# Patient Record
Sex: Male | Born: 1965 | Race: Black or African American | Hispanic: No | Marital: Single | Smoking: Former smoker
Health system: Southern US, Community
[De-identification: ages and names within clinical notes are randomized; demographics above are authoritative.]

## PROBLEM LIST (undated history)

## (undated) DIAGNOSIS — F909 Attention-deficit hyperactivity disorder, unspecified type: Secondary | ICD-10-CM

## (undated) HISTORY — DX: Attention-deficit hyperactivity disorder, unspecified type: F90.9

## (undated) HISTORY — PX: NO PAST SURGERIES: SHX2092

---

## 2015-05-09 ENCOUNTER — Ambulatory Visit (INDEPENDENT_AMBULATORY_CARE_PROVIDER_SITE_OTHER): Payer: No Typology Code available for payment source | Admitting: Allergy and Immunology

## 2015-05-09 ENCOUNTER — Encounter: Payer: Self-pay | Admitting: Allergy and Immunology

## 2015-05-09 VITALS — BP 102/68 | HR 76 | Temp 98.2°F | Resp 16 | Ht 75.0 in | Wt 173.3 lb

## 2015-05-09 DIAGNOSIS — J3089 Other allergic rhinitis: Secondary | ICD-10-CM

## 2015-05-09 MED ORDER — LEVOCETIRIZINE DIHYDROCHLORIDE 5 MG PO TABS: 5.0000 mg | ORAL_TABLET | Freq: Every evening | ORAL | Status: DC

## 2015-05-09 MED ORDER — AZELASTINE-FLUTICASONE 137-50 MCG/ACT NA SUSP: 1.0000 | Freq: Two times a day (BID) | NASAL | Status: DC

## 2015-05-09 NOTE — Patient Instructions (Addendum)
Other allergic rhinitis  Aeroallergen avoidance measures have been discussed and provided in written form.  A prescription has been provided for levocetirizine, 5 mg daily as needed.  A prescription has been provided for Dymista (azelastine/fluticasone) nasal spray, 1 spray per nostril twice daily as needed. Proper nasal spray technique has been discussed and demonstrated.  Nasal saline lavage as needed has been recommended along with instructions for proper administration.  The risks and benefits of aeroallergen immunotherapy have been discussed. The patient is motivated to initiate immunotherapy to reduce symptoms and decrease medication requirement. Informed consent has been signed and allergen vaccine orders have been submitted. Medications will be decreased or discontinued as symptom relief from immunotherapy becomes evident.     Return in about 6 weeks (around 06/20/2015), or if symptoms worsen or fail to improve.  Reducing Pollen Exposure  The American Academy of Allergy, Asthma and Immunology suggests the following steps to reduce your exposure to pollen during allergy seasons.    1. Do not hang sheets or clothing out to dry; pollen may collect on these items. 2. Do not mow lawns or spend time around freshly cut grass; mowing stirs up pollen. 3. Keep windows closed at night.  Keep car windows closed while driving. 4. Minimize morning activities outdoors, a time when pollen counts are usually at their highest. 5. Stay indoors as much as possible when pollen counts or humidity is high and on windy days when pollen tends to remain in the air longer. 6. Use air conditioning when possible.  Many air conditioners have filters that trap the pollen spores. 7. Use a HEPA room air filter to remove pollen form the indoor air you breathe.   Control of House Dust Mite Allergen  House dust mites play a major role in allergic asthma and rhinitis.  They occur in environments with high  humidity wherever human skin, the food for dust mites is found. High levels have been detected in dust obtained from mattresses, pillows, carpets, upholstered furniture, bed covers, clothes and soft toys.  The principal allergen of the house dust mite is found in its feces.  A gram of dust may contain 1,000 mites and 250,000 fecal particles.  Mite antigen is easily measured in the air during house cleaning activities.    1. Encase mattresses, including the box spring, and pillow, in an air tight cover.  Seal the zipper end of the encased mattresses with wide adhesive tape. 2. Wash the bedding in water of 130 degrees Farenheit weekly.  Avoid cotton comforters/quilts and flannel bedding: the most ideal bed covering is the dacron comforter. 3. Remove all upholstered furniture from the bedroom. 4. Remove carpets, carpet padding, rugs, and non-washable window drapes from the bedroom.  Wash drapes weekly or use plastic window coverings. 5. Remove all non-washable stuffed toys from the bedroom.  Wash stuffed toys weekly. 6. Have the room cleaned frequently with a vacuum cleaner and a damp dust-mop.  The patient should not be in a room which is being cleaned and should wait 1 hour after cleaning before going into the room. 7. Close and seal all heating outlets in the bedroom.  Otherwise, the room will become filled with dust-laden air.  An electric heater can be used to heat the room. 8. Reduce indoor humidity to less than 50%.  Do not use a humidifier.  Control of Cockroach Allergen  Cockroach allergen has been identified as an important cause of acute attacks of asthma, especially in urban settings.  There  are fifty-five species of cockroach that exist in the [place], however only three, the [place], [place] species produce allergen that can affect patients with Asthma.  Allergens can be obtained from fecal particles, egg casings and secretions from cockroaches.    1. Remove food  sources. 2. Reduce access to water. 3. Seal access and entry points. 4. Spray runways with 0.5-1% Diazinon or Chlorpyrifos 5. Blow boric acid power under stoves and refrigerator. 6. Place bait stations (hydramethylnon) at feeding sites.

## 2015-05-09 NOTE — Progress Notes (Signed)
History of present illness: HPI Comments: Mark [name] is a 49 y.o. male who presents today for his initial consultation for allergies.  He experiences frequent nasal congestion, rhinorrhea, sneezing, postnasal drainage, and itchy/watery eyes.  These symptoms occur year around but seem to be most severe when pollen counts are high. Cetirizine, fexofenadine, loratadine, and fluticasone nasal spray have failed to provide adequate symptom relief.  He denies a history of asthma or significant lower respiratory symptoms.   Assessment and plan: Other allergic rhinitis  Aeroallergen avoidance measures have been discussed and provided in written form.  A prescription has been provided for levocetirizine, 5 mg daily as needed.  A prescription has been provided for Dymista (azelastine/fluticasone) nasal spray, 1 spray per nostril twice daily as needed. Proper nasal spray technique has been discussed and demonstrated.  Nasal saline lavage as needed has been recommended along with instructions for proper administration.  The risks and benefits of aeroallergen immunotherapy have been discussed. The patient is motivated to initiate immunotherapy to reduce symptoms and decrease medication requirement. Informed consent has been signed and allergen vaccine orders have been submitted. Medications will be decreased or discontinued as symptom relief from immunotherapy becomes evident.     Medications ordered this encounter: Meds ordered this encounter  Medications  . Azelastine-Fluticasone 137-50 MCG/ACT SUSP    Sig: Place 1 spray into the nose 2 (two) times daily.    Dispense:  1 Bottle    Refill:  5  . levocetirizine (XYZAL) 5 MG tablet    Sig: Take 1 tablet (5 mg total) by mouth every evening.    Dispense:  30 tablet    Refill:  5    Aeroallergen immunotherapy   Diagnositics: Allergy skin testing: Positive to grass pollen, weed pollen, ragweed pollen, tree pollen, dust mite, and cockroach  antigen.     Physical examination: Blood pressure 102/68, pulse 76, temperature 98.2 F (36.8 C), temperature source Oral, resp. rate 16, height  (1.905 m), weight 173 lb 4.5 oz (78.6 kg).  General: Alert, interactive, in no acute distress. HEENT: TMs pearly gray, turbinates moderately edematous with clear discharge, post-pharynx moderately erythematous. Neck: Supple without lymphadenopathy. Lungs: Clear to auscultation without wheezing, rhonchi or rales. CV: Normal S1, S2 without murmurs. Abdomen: Nondistended, nontender. Skin: Warm and dry, without lesions or rashes. Extremities:  No clubbing, cyanosis or edema. Neuro:   Grossly intact.  Review of systems: Review of Systems  Constitutional: Negative for fever, chills and weight loss.  HENT: Negative for nosebleeds.   Eyes: Negative for blurred vision.  Respiratory: Negative for hemoptysis.   Cardiovascular: Negative for chest pain.  Gastrointestinal: Negative for diarrhea and constipation.  Genitourinary: Negative for dysuria.  Musculoskeletal: Negative for myalgias and joint pain.  Neurological: Negative for dizziness.  Endo/Heme/Allergies: Does not bruise/bleed easily.    Past medical history: Past Medical History  Diagnosis Date  . ADHD (attention deficit hyperactivity disorder)     Past surgical history: Past Surgical History  Procedure Laterality Date  . No past surgeries      Family history: Family History  Problem Relation Age of Onset  . Allergic rhinitis Mother   . Allergic rhinitis Sister     Social history: Social History   Social History  . Marital Status: Single    Spouse Name: N/A  . Number of Children: N/A  . Years of Education: N/A   Occupational History  . Not on file.   Social History Main Topics  . Smoking status: Former Smoker  Quit date: 05/07/2014  . Smokeless tobacco: Not on file  . Alcohol Use: Not on file  . Drug Use: Not on file  . Sexual Activity: Not on file    Other Topics Concern  . Not on file   Social History Narrative  . No narrative on file   Environmental History:  Mark [name] lives in a 50 year old condominium with hardwood floors throughout and central air/heat.  He is a former smoker without pets.  Known medication allergies: No Known Allergies  Outpatient medications:   Medication List       This list is accurate as of: 05/09/15  1:11 PM.  Always use your most recent med list.               ADDERALL 30 MG tablet  Generic drug:  amphetamine-dextroamphetamine  Take 30 mg by mouth daily.     Azelastine-Fluticasone 137-50 MCG/ACT Susp  Place 1 spray into the nose 2 (two) times daily.     levocetirizine 5 MG tablet  Commonly known as:  XYZAL  Take 1 tablet (5 mg total) by mouth every evening.        I appreciate the opportunity to take part in this Leighton's care. Please do not hesitate to contact me with questions.  Sincerely,   R. Jorene Guestarter Lonni Dirden, MD

## 2015-05-09 NOTE — Assessment & Plan Note (Addendum)
   Aeroallergen avoidance measures have been discussed and provided in written form.  A prescription has been provided for levocetirizine, 5 mg daily as needed.  A prescription has been provided for Dymista (azelastine/fluticasone) nasal spray, 1 spray per nostril twice daily as needed. Proper nasal spray technique has been discussed and demonstrated.  Nasal saline lavage as needed has been recommended along with instructions for proper administration.  The risks and benefits of aeroallergen immunotherapy have been discussed. The patient is motivated to initiate immunotherapy to reduce symptoms and decrease medication requirement. Informed consent has been signed and allergen vaccine orders have been submitted. Medications will be decreased or discontinued as symptom relief from immunotherapy becomes evident.

## 2015-05-10 DIAGNOSIS — J301 Allergic rhinitis due to pollen: Secondary | ICD-10-CM | POA: Diagnosis not present

## 2015-05-11 DIAGNOSIS — J3089 Other allergic rhinitis: Secondary | ICD-10-CM | POA: Diagnosis not present

## 2015-06-20 ENCOUNTER — Ambulatory Visit: Payer: Non-veteran care | Admitting: Allergy and Immunology

## 2015-07-10 ENCOUNTER — Ambulatory Visit: Payer: Non-veteran care | Admitting: Allergy and Immunology

## 2016-02-26 ENCOUNTER — Emergency Department (HOSPITAL_COMMUNITY): Payer: Non-veteran care

## 2016-02-26 ENCOUNTER — Emergency Department (HOSPITAL_COMMUNITY)
Admission: EM | Admit: 2016-02-26 | Discharge: 2016-02-27 | Disposition: A | Discharge status: Home / Self Care | Payer: Non-veteran care | Attending: Emergency Medicine | Admitting: Emergency Medicine

## 2016-02-26 ENCOUNTER — Encounter (HOSPITAL_COMMUNITY): Payer: Self-pay | Admitting: Emergency Medicine

## 2016-02-26 DIAGNOSIS — Z87891 Personal history of nicotine dependence: Secondary | ICD-10-CM | POA: Diagnosis not present

## 2016-02-26 DIAGNOSIS — R0789 Other chest pain: Secondary | ICD-10-CM | POA: Insufficient documentation

## 2016-02-26 DIAGNOSIS — F909 Attention-deficit hyperactivity disorder, unspecified type: Secondary | ICD-10-CM | POA: Diagnosis not present

## 2016-02-26 DIAGNOSIS — R079 Chest pain, unspecified: Secondary | ICD-10-CM

## 2016-02-26 LAB — I-STAT TROPONIN, ED: Troponin i, poc: 0 ng/mL (ref 0.00–0.08)

## 2016-02-26 LAB — CBC
HEMATOCRIT: 39.8 % (ref 39.0–52.0)
Hemoglobin: 13.8 g/dL (ref 13.0–17.0)
MCH: 33.7 pg (ref 26.0–34.0)
MCHC: 34.7 g/dL (ref 30.0–36.0)
MCV: 97.3 fL (ref 78.0–100.0)
PLATELETS: 307 10*3/uL (ref 150–400)
RBC: 4.09 MIL/uL — AB (ref 4.22–5.81)
RDW: 12.6 % (ref 11.5–15.5)
WBC: 6.7 10*3/uL (ref 4.0–10.5)

## 2016-02-26 LAB — BASIC METABOLIC PANEL
Anion gap: 10 (ref 5–15)
CHLORIDE: 93 mmol/L — AB (ref 101–111)
CO2: 24 mmol/L (ref 22–32)
Calcium: 9.4 mg/dL (ref 8.9–10.3)
Creatinine, Ser: 0.75 mg/dL (ref 0.61–1.24)
Glucose, Bld: 181 mg/dL — ABNORMAL HIGH (ref 65–99)
POTASSIUM: 3.8 mmol/L (ref 3.5–5.1)
SODIUM: 127 mmol/L — AB (ref 135–145)

## 2016-02-26 NOTE — ED Triage Notes (Signed)
Pt sts left sided CP a couple of hours ago that is intermittent in nature; pt denies SOB

## 2016-02-26 NOTE — ED Provider Notes (Signed)
TIME SEEN: 11:21 PM  CHIEF COMPLAINT: Chest Pain  HPI: Mark [name] is a 50 y.o. male with h/o ADD who presents to the Emergency Department complaining of sharp, intermittent, left sided CP onset 3 PM today. Pt denies ever having CP episodes in the past. Pt is unsure if his current symptoms are due to his taking adderall for his ADD. Pt reports that when he has the CP episodes, it will last for 5-10 seconds. Pt states that his CP is alleviated with position change. He states that he is having associated symptoms of clammy hands, mild SOB, and dizziness. He states that he has not tried any medications for the relief for his symptoms. He denies nausea, vomiting, leg swelling, and any other symptoms. Denies high cholesterol, DM, and HTN. Pt is an occasional cigarette smoker. Denies family hx of cardiac issues. Denies PMHx of DVT, PE, lower extremity swelling or pain, fractures, recent hospitalization, long travel or other immobilization. Asymptomatic currently.    ROS: See HPI Constitutional: no fever  Eyes: no drainage  ENT: no runny nose   Cardiovascular:  +chest pain  Resp: +SOB  GI: no vomiting GU: no dysuria Integumentary: no rash  Allergy: no hives  Musculoskeletal: no leg swelling  Neurological: no slurred speech ROS otherwise negative  PAST MEDICAL HISTORY/PAST SURGICAL HISTORY:  Past Medical History:  Diagnosis Date  . ADHD (attention deficit hyperactivity disorder)     MEDICATIONS:  Prior to Admission medications   Medication Sig Start Date End Date Taking? Authorizing Provider  amphetamine-dextroamphetamine (ADDERALL) 30 MG tablet Take 30 mg by mouth daily.    Historical Provider, MD  Azelastine-Fluticasone 137-50 MCG/ACT SUSP Place 1 spray into the nose 2 (two) times daily. 05/09/15   Cristal Fordalph Carter Bobbitt, MD  levocetirizine (XYZAL) 5 MG tablet Take 1 tablet (5 mg total) by mouth every evening. 05/09/15   Cristal Fordalph Carter Bobbitt, MD    ALLERGIES:  No Known  Allergies  SOCIAL HISTORY:  Social History  Substance Use Topics  . Smoking status: Former Smoker    Quit date: 05/07/2014  . Smokeless tobacco: Not on file  . Alcohol use Not on file    FAMILY HISTORY: Family History  Problem Relation Age of Onset  . Allergic rhinitis Mother   . Allergic rhinitis Sister     EXAM: BP 141/88   Pulse 89   Temp 97.7 F (36.5 C) (Oral)   Resp 13   SpO2 100%  CONSTITUTIONAL: Alert and oriented and responds appropriately to questions. Well-appearing; well-nourished HEAD: Normocephalic EYES: Conjunctivae clear, PERRL ENT: normal nose; no rhinorrhea; moist mucous membranes NECK: Supple, no meningismus, no LAD  CARD: RRR; S1 and S2 appreciated; no murmurs, no clicks, no rubs, no gallops RESP: Normal chest excursion without splinting or tachypnea; breath sounds clear and equal bilaterally; no wheezes, no rhonchi, no rales, no hypoxia or respiratory distress, speaking full sentences ABD/GI: Normal bowel sounds; non-distended; soft, non-tender, no rebound, no guarding, no peritoneal signs BACK:  The back appears normal and is non-tender to palpation, there is no CVA tenderness EXT: Normal ROM in all joints; non-tender to palpation; no edema; normal capillary refill; no cyanosis, no calf tenderness or swelling    SKIN: Normal color for age and race; warm; no rash NEURO: Moves all extremities equally, sensation to light touch intact diffusely, cranial nerves II through XII intact PSYCH: The patient's mood and manner are appropriate. Grooming and personal hygiene are appropriate.  MEDICAL DECISION MAKING: Patient here with very atypical  chest pain that last for only 5-10 seconds and then resolves. Gets better with changing position. Currently chest pain-free. EKG shows no ischemic abnormality. First troponin negative. No risk factors for pulmonary embolus. Plan is to repeat second troponin if patient is still hemodynamically stable, chest pain-free, will  discharge home with outpatient follow-up. He is comfortable with this plan.  ED PROGRESS: 12:20 AM  Pt's Second troponin is negative. Still chest pain-free. Given very atypical symptoms with low risk factors I feel he is safe to be discharged home. His heart score is 1.  Recommended outpatient follow-up. Discussed return precautions. He is comfortable with this plan.   At this time, I do not feel there is any life-threatening condition present. I have reviewed and discussed all results (EKG, imaging, lab, urine as appropriate), exam findings with patient/family. I have reviewed nursing notes and appropriate previous records.  I feel the patient is safe to be discharged home without further emergent workup and can continue workup as an outpatient. Discussed usual and customary return precautions. Patient/family verbalize understanding and are comfortable with this plan.  Outpatient follow-up has been provided. All questions have been answered.     EKG Interpretation  Date/Time:  Tuesday February 26 2016 17:14:52 EDT Ventricular Rate:  92 PR Interval:  166 QRS Duration: 86 QT Interval:  376 QTC Calculation: 464 R Axis:   39 Text Interpretation:  Normal sinus rhythm Normal ECG No old tracing to compare Confirmed by Kaled Allende,  DO, Ramy Greth ([id number]) on 02/26/2016 11:19:09 PM        I personally performed the services described in this documentation, which was scribed in my presence. The recorded information has been reviewed and is accurate.     Layla MawKristen N Janessa Mickle, DO 02/27/16 0025

## 2016-02-27 LAB — I-STAT TROPONIN, ED: Troponin i, poc: 0.01 ng/mL (ref 0.00–0.08)

## 2016-02-27 NOTE — Discharge Instructions (Addendum)
To find a primary care or specialty doctor please call [phone] or [phone] to access "[facility] Find a Doctor Service." ° °You may also go on the [facility] website at [email or web address] ° °There are also multiple [facility] practices throughout the [place] that are frequently accepting new patients. You may find a clinic that is close to your home and contact them. ° ° and Wellness -  °[address] °[place] [address] °[phone] ° °[facility] in [place] (also locations in [place] and [place]) -  °[address] °[place] [address] °[phone] ° °[facility] -  °[address] ° [place] [address] [phone] ° ° °

## 2017-04-20 ENCOUNTER — Encounter (HOSPITAL_COMMUNITY): Payer: Self-pay | Admitting: *Deleted

## 2017-04-20 ENCOUNTER — Ambulatory Visit (HOSPITAL_COMMUNITY)
Admission: EM | Admit: 2017-04-20 | Discharge: 2017-04-20 | Disposition: A | Discharge status: Home / Self Care | Payer: No Typology Code available for payment source | Attending: Emergency Medicine | Admitting: Emergency Medicine

## 2017-04-20 DIAGNOSIS — R200 Anesthesia of skin: Secondary | ICD-10-CM

## 2017-04-20 DIAGNOSIS — Z131 Encounter for screening for diabetes mellitus: Secondary | ICD-10-CM | POA: Diagnosis not present

## 2017-04-20 DIAGNOSIS — R202 Paresthesia of skin: Secondary | ICD-10-CM

## 2017-04-20 LAB — GLUCOSE, CAPILLARY: Glucose-Capillary: 88 mg/dL (ref 65–99)

## 2017-04-20 NOTE — ED Provider Notes (Signed)
[facility]    CSN: [id number] Arrival date & time: 04/20/17  1049     History   Chief Complaint Chief Complaint  Patient presents with  . Foot Problem    HPI GLENDELL FOUSE is a 51 y.o. male.   51 year old male with history of ADHD comes for 2-3 month history of numbness/tingling of the soles of both foot. States it feels like his feet "falls asleep". Denies injury, past injury. Denies aggravating and alleviating factor. Denies swelling, pain, spreading erythema, increased warmth. Has not noticed a pattern to the symptoms. He states work requires long hours of standing and sitting, but has not noticed certain positions that can cause the numbness and tingling. Has not tried anything for the symptoms. Denies personal or family history of diabetes. Denies personal history of hypertension.       Past Medical History:  Diagnosis Date  . ADHD (attention deficit hyperactivity disorder)     Patient Active Problem List   Diagnosis Date Noted  . Other allergic rhinitis 05/09/2015    Past Surgical History:  Procedure Laterality Date  . NO PAST SURGERIES         Home Medications    Prior to Admission medications   Medication Sig Start Date End Date Taking? Authorizing Provider  amphetamine-dextroamphetamine (ADDERALL) 30 MG tablet Take 30 mg by mouth daily.    [provider]  Azelastine-Fluticasone 137-50 MCG/ACT SUSP Place 1 spray into the nose 2 (two) times daily. 05/09/15   Bobbitt, Heywood Iles, MD  levocetirizine (XYZAL) 5 MG tablet Take 1 tablet (5 mg total) by mouth every evening. 05/09/15   Bobbitt, Heywood Iles, MD    Family History Family History  Problem Relation Age of Onset  . Allergic rhinitis Mother   . Allergic rhinitis Sister     Social History Social History  Substance Use Topics  . Smoking status: Former Smoker    Quit date: 05/07/2014  . Smokeless tobacco: Never Used  . Alcohol use Not on file     Allergies     Patient has no known allergies.   Review of Systems Review of Systems  Reason unable to perform ROS: See HPI as above.     Physical Exam Triage Vital Signs ED Triage Vitals [04/20/17 1142]  Enc Vitals Group     BP 116/78     Pulse Rate 79     Resp 17     Temp 98.9 F (37.2 C)     Temp Source Oral     SpO2 100 %     Weight      Height      Head Circumference      Peak Flow      Pain Score      Pain Loc      Pain Edu?      Excl. in GC?    No data found.   Updated Vital Signs BP 116/78 (BP Location: Left Arm)   Pulse 79   Temp 98.9 F (37.2 C) (Oral)   Resp 17   SpO2 100%    Physical Exam  Constitutional: He is oriented to person, place, and time. He appears well-developed and well-nourished. No distress.  HENT:  Head: Normocephalic and atraumatic.  Eyes: Pupils are equal, round, and reactive to light. Conjunctivae are normal.  Cardiovascular:  Pulses:      Dorsalis pedis pulses are 2+ on the right side, and 2+ on the left side.  Posterior tibial pulses are 2+ on the right side, and 2+ on the left side.  Musculoskeletal:       Right foot: There is normal range of motion and no deformity.       Left foot: There is normal range of motion and no deformity.  No swelling, erythema, increased warmth, obvious deformities noted. No tenderness on palpation. Full range of motion of ankle and foot. Strength normal and equal bilaterally. Sensation intact and equal bilaterally.  Pedal pulses 2+ and equal bilaterally. Cap refill less than 2 seconds.  Feet:  Right Foot:  Protective Sensation: 10 sites tested. 10 sites sensed.  Skin Integrity: Negative for ulcer or blister.  Left Foot:  Protective Sensation: 10 sites tested. 10 sites sensed.  Skin Integrity: Negative for ulcer or blister.  Neurological: He is alert and oriented to person, place, and time.     UC Treatments / Results  Labs (all labs ordered are listed, but only abnormal results are  displayed) Labs Reviewed  GLUCOSE, CAPILLARY    EKG  EKG Interpretation None       Radiology No results found.  Procedures Procedures (including critical care time)  Medications Ordered in UC Medications - No data to display   Initial Impression / Assessment and Plan / UC Course  I have reviewed the triage vital signs and the nursing notes.  Pertinent labs & imaging results that were available during my care of the patient were reviewed by me and considered in my medical decision making (see chart for details).    Normal exam without alarming signs. CBG 88 today. Discussed possible causes of numbness/tingling such as inflammation, nerve compression, diabetes, injury. Trial of NSAIDs. Patient to monitor for patterns of symptom onset. Follow up with PCP for further evaluation and treatment needed. Information of podiatry provided as needed.   Final Clinical Impressions(s) / UC Diagnoses   Final diagnoses:  Numbness and tingling of foot    New Prescriptions Discharge Medication List as of 04/20/2017 12:45 PM        Belinda Fisher, PA-C 04/20/17 1258

## 2017-04-20 NOTE — ED Triage Notes (Signed)
Patient reports numbness to toes x several months. No medical history.

## 2017-04-20 NOTE — Discharge Instructions (Signed)
No alarming signs on exam. Numbness and tingling can be caused by inflammation, nerve compression, diabetes, decreased blood supply, injury. Blood sugar is normal today. Pulses good indicating good blood supply. You can try taking ibuprofen  three times a day for 10 days to see if it improves symptoms. Monitor for patterns of what can cause worsening of symptoms, such as a certain position of your foot, long standing hours, long sitting hours. Follow up with PCP for further evaluation and treatment needed. I have attached podiatry information for further evaluation as needed.

## 2018-02-16 ENCOUNTER — Emergency Department (HOSPITAL_COMMUNITY)
Admission: EM | Admit: 2018-02-16 | Discharge: 2018-02-16 | Disposition: A | Discharge status: Home / Self Care | Payer: Non-veteran care | Attending: Emergency Medicine | Admitting: Emergency Medicine

## 2018-02-16 ENCOUNTER — Encounter (HOSPITAL_COMMUNITY): Payer: Self-pay

## 2018-02-16 ENCOUNTER — Emergency Department (HOSPITAL_COMMUNITY): Payer: Non-veteran care

## 2018-02-16 ENCOUNTER — Other Ambulatory Visit: Payer: Self-pay

## 2018-02-16 DIAGNOSIS — W19XXXA Unspecified fall, initial encounter: Secondary | ICD-10-CM | POA: Diagnosis not present

## 2018-02-16 DIAGNOSIS — Y92522 Railway station as the place of occurrence of the external cause: Secondary | ICD-10-CM | POA: Diagnosis not present

## 2018-02-16 DIAGNOSIS — S0101XA Laceration without foreign body of scalp, initial encounter: Secondary | ICD-10-CM | POA: Insufficient documentation

## 2018-02-16 DIAGNOSIS — Y9389 Activity, other specified: Secondary | ICD-10-CM | POA: Diagnosis not present

## 2018-02-16 DIAGNOSIS — R55 Syncope and collapse: Secondary | ICD-10-CM | POA: Insufficient documentation

## 2018-02-16 DIAGNOSIS — Z79899 Other long term (current) drug therapy: Secondary | ICD-10-CM | POA: Diagnosis not present

## 2018-02-16 DIAGNOSIS — Z23 Encounter for immunization: Secondary | ICD-10-CM | POA: Insufficient documentation

## 2018-02-16 DIAGNOSIS — Y998 Other external cause status: Secondary | ICD-10-CM | POA: Diagnosis not present

## 2018-02-16 DIAGNOSIS — Z87891 Personal history of nicotine dependence: Secondary | ICD-10-CM | POA: Diagnosis not present

## 2018-02-16 LAB — CBC
HEMATOCRIT: 41.5 % (ref 39.0–52.0)
HEMOGLOBIN: 14.3 g/dL (ref 13.0–17.0)
MCH: 36 pg — ABNORMAL HIGH (ref 26.0–34.0)
MCHC: 34.5 g/dL (ref 30.0–36.0)
MCV: 104.5 fL — ABNORMAL HIGH (ref 78.0–100.0)
Platelets: 229 10*3/uL (ref 150–400)
RBC: 3.97 MIL/uL — AB (ref 4.22–5.81)
RDW: 12.4 % (ref 11.5–15.5)
WBC: 6.1 10*3/uL (ref 4.0–10.5)

## 2018-02-16 LAB — BASIC METABOLIC PANEL
ANION GAP: 16 — AB (ref 5–15)
BUN: 6 mg/dL (ref 6–20)
CALCIUM: 8.6 mg/dL — AB (ref 8.9–10.3)
CO2: 23 mmol/L (ref 22–32)
Chloride: 100 mmol/L (ref 98–111)
Creatinine, Ser: 1.23 mg/dL (ref 0.61–1.24)
GFR calc Af Amer: >60 mL/min (ref >60)
Glucose, Bld: 127 mg/dL — ABNORMAL HIGH (ref 70–99)
POTASSIUM: 3.5 mmol/L (ref 3.5–5.1)
SODIUM: 139 mmol/L (ref 135–145)

## 2018-02-16 LAB — URINALYSIS, ROUTINE W REFLEX MICROSCOPIC
BILIRUBIN URINE: NEGATIVE
Bacteria, UA: NONE SEEN
Glucose, UA: 50 mg/dL — AB
Hgb urine dipstick: NEGATIVE
Ketones, ur: 5 mg/dL — AB
LEUKOCYTES UA: NEGATIVE
NITRITE: NEGATIVE
PH: 6 (ref 5.0–8.0)
PROTEIN: 100 mg/dL — AB
Specific Gravity, Urine: 1.02 (ref 1.005–1.030)

## 2018-02-16 LAB — CBG MONITORING, ED: GLUCOSE-CAPILLARY: 120 mg/dL — AB (ref 70–99)

## 2018-02-16 LAB — ETHANOL: ALCOHOL ETHYL (B): 264 mg/dL — AB (ref <10)

## 2018-02-16 LAB — TROPONIN I: Troponin I: <0.03 ng/mL (ref <0.03)

## 2018-02-16 MED ORDER — TETANUS-DIPHTH-ACELL PERTUSSIS 5-2.5-18.5 LF-MCG/0.5 IM SUSP
0.5000 mL | Freq: Once | INTRAMUSCULAR | Status: AC
  Administered 2018-02-16: 0.5 mL via INTRAMUSCULAR
  Filled 2018-02-16: qty 0.5

## 2018-02-16 MED ORDER — KETOROLAC TROMETHAMINE 15 MG/ML IJ SOLN
15.0000 mg | Freq: Once | INTRAMUSCULAR | Status: AC
  Administered 2018-02-16: 15 mg via INTRAVENOUS
  Filled 2018-02-16: qty 1

## 2018-02-16 NOTE — ED Notes (Signed)
Patient transported to X-ray 

## 2018-02-16 NOTE — ED Provider Notes (Signed)
[facility] EMERGENCY DEPARTMENT Provider Note   CSN: [id number] Arrival date & time: 02/16/18  1721     History   Chief Complaint Chief Complaint  Patient presents with  . Loss of Consciousness    HPI Mark [name] is a 52 y.o. male is a 52 year old male with a history of ADHD who presents due to loss of consciousness.  [place] he says he suddenly blacked out.  He struck his head.  He was reportedly unconscious for about 45 seconds.  He says this happened to him once several years ago.  He believes it is because he has not slept well and 4 to 5 days, has not been eating, and had 2-3 beers earlier this afternoon.  He denies having chest pain or shortness of breath.  He denies motor weakness and changes in sensation.  He has no family history of early MI early cardiac death.  HPI  Past Medical History:  Diagnosis Date  . ADHD (attention deficit hyperactivity disorder)     Patient Active Problem List   Diagnosis Date Noted  . Other allergic rhinitis 05/09/2015    Past Surgical History:  Procedure Laterality Date  . NO PAST SURGERIES          Home Medications    Prior to Admission medications   Medication Sig Start Date End Date Taking? Authorizing Provider  ARTIFICIAL TEAR OP Apply 2-3 drops to eye daily as needed (Dryness).   Yes [provider]  Azelastine-Fluticasone 137-50 MCG/ACT SUSP Place 1 spray into the nose 2 (two) times daily. 05/09/15  Yes Bobbitt, Heywood Iles, MD  loratadine (CLARITIN) 10 MG tablet Take 10 mg by mouth daily.   Yes [provider]  Tetrahydrozoline HCl (VISINE OP) Place 2-3 drops into both ears daily as needed (Redness).   Yes [provider]  levocetirizine (XYZAL) 5 MG tablet Take 1 tablet (5 mg total) by mouth every evening. Patient not taking: Reported on 02/16/2018 05/09/15   Bobbitt, Heywood Iles, MD    Family History Family History  Problem Relation Age of  Onset  . Allergic rhinitis Mother   . Allergic rhinitis Sister     Social History Social History   Tobacco Use  . Smoking status: Former Smoker    Last attempt to quit: 05/07/2014    Years since quitting: 3.7  . Smokeless tobacco: Never Used  Substance Use Topics  . Alcohol use: Not on file  . Drug use: Not on file     Allergies   Patient has no known allergies.   Review of Systems Review of Systems Review of Systems   Constitutional  Negative for fever  Negative for chills  HENT  Negative for ear pain  Negative for sore throat  Negative for difficultly swallowing  Eyes  Negative for eye pain  Negative for visual disturbance  Respiratory  Negative for shortness of breath  Negative for cough  CV  Negative for chest pain  Negative for leg swelling  Abdomen  Negative for abdominal pain  Negative for nausea  Negative for vomiting  MSK  Negative for extremity pain  Negative for back pain  Skin  Negative for rash  +for wound  Neuro  Negative for syncope  Negative for difficultly speaking  Psych  Negative for confusion   The remainder of the ROS was reviewed and negative except as documented above.      Physical Exam Updated Vital Signs BP  121/79   Pulse 89   Temp 98.1 F (36.7 C) (Oral)   Resp (!) 21   Ht 6\' 3"  (1.905 m)   Wt 77.1 kg   SpO2 100%   BMI 21.25 kg/m   Physical Exam Physical Exam Constitutional  Nursing notes reviewed  Vital signs reviewed  HEENT  No obvious trauma  Supple without meningismus, mass, or overt JVD  EOMI  No scleral icterus or injection  Respiratory  Effort normal  CTAB  No respiratory distress  CV  Normal rate  No obvious murmurs  No pitting edema  Equal pulses in all extremities  Chest not tender to palpation  Abdomen  Soft  Non-tender  Non-distended  No peritonitis  MSK  Atraumatic  No obvious deformity  ROM appropriate  Skin  Warm  Dry  Abrasion on left  knee  2cm laceration to left parietal scalp  Neuro  Awake and alert  EOMI  Moving all extremities  Denies numbness/tingling  Psychiatric  Mood and affect normal        ED Treatments / Results  Labs (all labs ordered are listed, but only abnormal results are displayed) Labs Reviewed  BASIC METABOLIC PANEL - Abnormal; Notable for the following components:      Result Value   Glucose, Bld 127 (*)    Calcium 8.6 (*)    Anion gap 16 (*)    All other components within normal limits  CBC - Abnormal; Notable for the following components:   RBC 3.97 (*)    MCV 104.5 (*)    MCH 36.0 (*)    All other components within normal limits  URINALYSIS, ROUTINE W REFLEX MICROSCOPIC - Abnormal; Notable for the following components:   Color, Urine AMBER (*)    APPearance HAZY (*)    Glucose, UA 50 (*)    Ketones, ur 5 (*)    Protein, ur 100 (*)    All other components within normal limits  ETHANOL - Abnormal; Notable for the following components:   Alcohol, Ethyl (B) 264 (*)    All other components within normal limits  CBG MONITORING, ED - Abnormal; Notable for the following components:   Glucose-Capillary 120 (*)    All other components within normal limits  TROPONIN I    EKG EKG Interpretation  Date/Time:  Tuesday February 16 2018 17:23:42 EDT Ventricular Rate:  103 PR Interval:    QRS Duration: 93 QT Interval:  359 QTC Calculation: 470 R Axis:   65 Text Interpretation:  Sinus tachycardia Confirmed by Blane OharaZavitz, Joshua [phone]([id number]) on 02/16/2018 6:36:50 PM   Radiology Dg Chest 2 View  Result Date: 02/16/2018 CLINICAL DATA:  Syncope EXAM: CHEST - 2 VIEW COMPARISON:  February 26, 2016 FINDINGS: No edema or consolidation. The heart size and pulmonary vascularity are normal. No pneumothorax. No adenopathy. No bone lesions. IMPRESSION: No edema or consolidation. Electronically Signed   By: Bretta BangWilliam  Woodruff III M.D.   On: 02/16/2018 19:02   Ct Head Wo Contrast  Result Date:  02/16/2018 CLINICAL DATA:  Initial evaluation for acute syncope, trauma. EXAM: CT HEAD WITHOUT CONTRAST CT CERVICAL SPINE WITHOUT CONTRAST TECHNIQUE: Multidetector CT imaging of the head and cervical spine was performed following the standard protocol without intravenous contrast. Multiplanar CT image reconstructions of the cervical spine were also generated. COMPARISON:  None. FINDINGS: CT HEAD FINDINGS Brain: Cerebral volume within normal limits for patient age. No evidence for acute intracranial hemorrhage. No findings to suggest acute large vessel territory infarct. No  mass lesion, midline shift, or mass effect. Ventricles are normal in size without evidence for hydrocephalus. No extra-axial fluid collection identified. Vascular: No hyperdense vessel identified. Skull: Left frontoparietal scalp laceration with skin staples in place. Calvarium intact. Sinuses/Orbits: Globes and orbital soft tissues within normal limits. Visualized paranasal sinuses are clear. No mastoid effusion. CT CERVICAL SPINE FINDINGS Alignment: Straightening of the normal cervical lordosis. No listhesis. Skull base and vertebrae: Skull base intact. Normal C1-2 articulations are preserved in the dens is intact. Vertebral body heights maintained. No acute fracture. Soft tissues and spinal canal: Soft tissues of the neck demonstrate no acute finding. No abnormal prevertebral edema. Spinal canal within normal limits. 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 Multilevel facet arthropathy, greatest within the upper cervical spine. Upper chest: Visualized upper chest within normal limits. Other: None. IMPRESSION: CT BRAIN: 1. No acute intracranial abnormality. 2. Left frontoparietal scalp laceration.  No calvarial fracture. CT CERVICAL SPINE: No acute traumatic injury within the cervical spine. Electronically Signed   By: Rise MuBenjamin  McClintock M.D.   On: 02/16/2018 20:44   Ct Cervical Spine Wo Contrast  Result  Date: 02/16/2018 CLINICAL DATA:  Initial evaluation for acute syncope, trauma. EXAM: CT HEAD WITHOUT CONTRAST CT CERVICAL SPINE WITHOUT CONTRAST TECHNIQUE: Multidetector CT imaging of the head and cervical spine was performed following the standard protocol without intravenous contrast. Multiplanar CT image reconstructions of the cervical spine were also generated. COMPARISON:  None. FINDINGS: CT HEAD FINDINGS Brain: Cerebral volume within normal limits for patient age. No evidence for acute intracranial hemorrhage. No findings to suggest acute large vessel territory infarct. No mass lesion, midline shift, or mass effect. Ventricles are normal in size without evidence for hydrocephalus. No extra-axial fluid collection identified. Vascular: No hyperdense vessel identified. Skull: Left frontoparietal scalp laceration with skin staples in place. Calvarium intact. Sinuses/Orbits: Globes and orbital soft tissues within normal limits. Visualized paranasal sinuses are clear. No mastoid effusion. CT CERVICAL SPINE FINDINGS Alignment: Straightening of the normal cervical lordosis. No listhesis. Skull base and vertebrae: Skull base intact. Normal C1-2 articulations are preserved in the dens is intact. Vertebral body heights maintained. No acute fracture. Soft tissues and spinal canal: Soft tissues of the neck demonstrate no acute finding. No abnormal prevertebral edema. Spinal canal within normal limits. 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 Multilevel facet arthropathy, greatest within the upper cervical spine. Upper chest: Visualized upper chest within normal limits. Other: None. IMPRESSION: CT BRAIN: 1. No acute intracranial abnormality. 2. Left frontoparietal scalp laceration.  No calvarial fracture. CT CERVICAL SPINE: No acute traumatic injury within the cervical spine. Electronically Signed   By: Rise MuBenjamin  McClintock M.D.   On: 02/16/2018 20:44    Procedures .Marland Kitchen.Laceration  Repair Date/Time: 02/16/2018 7:27 PM Performed by: Talitha GivensAshburn, Prestin Munch, MD Authorized by: Talitha GivensAshburn, Shaguana Love, MD   Consent:    Consent obtained:  Verbal   Consent given by:  Patient   Risks discussed:  Infection, need for additional repair and pain   Alternatives discussed:  No treatment Laceration details:    Location:  Scalp   Scalp location:  L parietal   Length (cm):  2 Repair type:    Repair type:  Simple Exploration:    Contaminated: no   Treatment:    Area cleansed with:  Saline   Amount of cleaning:  Extensive   Irrigation solution:  Sterile saline   Irrigation method:  Pressure wash   Visualized foreign bodies/material removed: no   Skin  repair:    Repair method:  Staples   Number of staples:  3 Approximation:    Approximation:  Loose Post-procedure details:    Patient tolerance of procedure:  Tolerated well, no immediate complications Comments:     Donnetta Hutching not effected   (including critical care time)  Medications Ordered in ED Medications  Tdap (BOOSTRIX) injection 0.5 mL (0.5 mLs Intramuscular Given 02/16/18 1831)  ketorolac (TORADOL) 15 MG/ML injection 15 mg (15 mg Intravenous Given 02/16/18 2102)     Initial Impression / Assessment and Plan / ED Course  I have reviewed the triage vital signs and the nursing notes.  Pertinent labs & imaging results that were available during my care of the patient were reviewed by me and considered in my medical decision making (see chart for details).    Mark [name] presents after a syncopal episode as per above.  The etiology of this is likely multifactorial, including lack of sleep, lack of eating, and having 2-3 beers earlier today.  CT head and neck were obtained.  These revealed no acute abnormalities.  His ECG revealed no signs of acute ischemia or dysrhythmia.  Screening labs revealed no acute abnormalities.  His troponin was not elevated.  His chest x-ray revealed no acute abnormalities.  His Tdap was updated.  His  scalp laceration with repaired with staples.  I instructed him to have these removed in 10 days.  He had no acute abnormalities on CBC or BMP.  Troponin was not elevated.  I have a low suspicion for ACS, PE, seizure, stroke, infection, and lethal dysrhythmia.  I believe that the patient is safe for discharge.  Although he reports having a couple drinks earlier today, he has decision-making capacity.  He agreed to see his PCP.  I provided ED return precautions.  Final Clinical Impressions(s) / ED Diagnoses   Final diagnoses:  Syncope, unspecified syncope type    ED Discharge Orders    None       Talitha Givens, MD 02/17/18 [id number]    Blane Ohara, MD 02/21/18 [phone]

## 2018-02-16 NOTE — ED Triage Notes (Signed)
Per [facility], [place] knee, lac to both. Not on thinners. Then had another syncopal episode with ems, went out for 45 seconds with diaphoresis. Initial BP 72/50 HR 100, CBG 135 spo2 98%. Most recent BP 116/77 after 500 bolus of NS. Axox4. Has etoh on board. C-collar in place.

## 2018-02-16 NOTE — ED Notes (Signed)
Provider bedside laceration repair.

## 2018-02-16 NOTE — ED Notes (Signed)
Pt dressing

## 2018-02-16 NOTE — Discharge Instructions (Signed)
Serita Grammeshauncey D Woolman:  Thank you for allowing us to take care of you today.  We hope you begin feeling better soon.  To-Do: Please follow-up with your primary doctor or call to schedule an appointment with a new primary care doctor Please return to the Emergency Department or call 911 if you experience chest pain, shortness of breath, severe pain, severe fever, altered mental status, or have any reason to think that you need emergency medical care.  Thank you again.  Hope you feel better soon.

## 2018-02-16 NOTE — ED Notes (Signed)
ED Provider at bedside. 

## 2018-02-16 NOTE — ED Triage Notes (Signed)
[place] ED [place] train [place] with complaints of syncopal episode lasting less than one minute since this afternoon. Pt reports taking allergy medicine and then drinking a few beers, also reports not sleeping well in 4-5 days and not eating much today. Pt remembers most of the event, is in no pain, does not remember hitting his head or leg, but witnessed said he did. Pt placed in position of comfort with bed locked and lowered, call bell in reach.

## 2018-02-16 NOTE — ED Notes (Signed)
Patient transported to CT 

## 2018-03-02 ENCOUNTER — Encounter (HOSPITAL_COMMUNITY): Payer: Self-pay | Admitting: *Deleted

## 2018-03-02 ENCOUNTER — Emergency Department (HOSPITAL_COMMUNITY)
Admission: EM | Admit: 2018-03-02 | Discharge: 2018-03-02 | Disposition: A | Discharge status: Home / Self Care | Payer: Non-veteran care | Attending: Emergency Medicine | Admitting: Emergency Medicine

## 2018-03-02 ENCOUNTER — Other Ambulatory Visit: Payer: Self-pay

## 2018-03-02 DIAGNOSIS — Z87891 Personal history of nicotine dependence: Secondary | ICD-10-CM | POA: Diagnosis not present

## 2018-03-02 DIAGNOSIS — Z79899 Other long term (current) drug therapy: Secondary | ICD-10-CM | POA: Insufficient documentation

## 2018-03-02 DIAGNOSIS — W19XXXA Unspecified fall, initial encounter: Secondary | ICD-10-CM | POA: Diagnosis not present

## 2018-03-02 DIAGNOSIS — S0101XD Laceration without foreign body of scalp, subsequent encounter: Secondary | ICD-10-CM | POA: Diagnosis present

## 2018-03-02 DIAGNOSIS — Z4802 Encounter for removal of sutures: Secondary | ICD-10-CM

## 2018-03-02 NOTE — ED Notes (Signed)
3 staples removed from patient head

## 2018-03-02 NOTE — ED Provider Notes (Signed)
[facility] Provider Note   CSN: [id number] Arrival date & time: 03/02/18  [id number]     History   Chief Complaint Chief Complaint  Patient presents with  . Suture / Staple Removal    HPI Mark [name] is a 52 y.o. male.  Patient presents for staple removal. Three staples placed 02/16/2018 after "black out" where he fell and struck head. Wound has been healing well, no complications. No complaints at this time.      Past Medical History:  Diagnosis Date  . ADHD (attention deficit hyperactivity disorder)     Patient Active Problem List   Diagnosis Date Noted  . Other allergic rhinitis 05/09/2015    Past Surgical History:  Procedure Laterality Date  . NO PAST SURGERIES          Home Medications    Prior to Admission medications   Medication Sig Start Date End Date Taking? Authorizing Provider  ARTIFICIAL TEAR OP Apply 2-3 drops to eye daily as needed (Dryness).    [provider]  Azelastine-Fluticasone 137-50 MCG/ACT SUSP Place 1 spray into the nose 2 (two) times daily. 05/09/15   Bobbitt, Heywood Ilesalph Carter, MD  levocetirizine (XYZAL) 5 MG tablet Take 1 tablet (5 mg total) by mouth every evening. Patient not taking: Reported on 02/16/2018 05/09/15   Bobbitt, Heywood Ilesalph Carter, MD  loratadine (CLARITIN) 10 MG tablet Take 10 mg by mouth daily.    [provider]  Tetrahydrozoline HCl (VISINE OP) Place 2-3 drops into both ears daily as needed (Redness).    [provider]    Family History Family History  Problem Relation Age of Onset  . Allergic rhinitis Mother   . Allergic rhinitis Sister     Social History Social History   Tobacco Use  . Smoking status: Former Smoker    Last attempt to quit: 05/07/2014    Years since quitting: 3.8  . Smokeless tobacco: Never Used  Substance Use Topics  . Alcohol use: Yes    Alcohol/week: 0.0 standard drinks  . Drug use: Never     Allergies   Patient has no  known allergies.   Review of Systems Review of Systems  Constitutional: Negative for fever.  Gastrointestinal: Negative for nausea and vomiting.  Skin: Positive for wound.  Neurological: Negative for headaches.     Physical Exam Updated Vital Signs BP (!) 137/104 (BP Location: Right Arm)   Pulse (!) 102   Temp 97.7 F (36.5 C) (Oral)   Resp 16   Ht 6\' 3"  (1.905 m)   Wt 77 kg   SpO2 99%   BMI 21.22 kg/m   Physical Exam  HENT:  Head: Normocephalic.  Scalp laceration with 3 staples in place, L parietal scalp. No signs of associated infection.      ED Treatments / Results  Labs (all labs ordered are listed, but only abnormal results are displayed) Labs Reviewed - No data to display  EKG None  Radiology No results found.  Procedures Procedures (including critical care time)  Medications Ordered in ED Medications - No data to display   Initial Impression / Assessment and Plan / ED Course  I have reviewed the triage vital signs and the nursing notes.  Pertinent labs & imaging results that were available during my care of the patient were reviewed by me and considered in my medical decision making (see chart for details).     Patient seen and examined. Staples removed by RN after  my eval. No complication. Discussed s/s of worsening infection and need to return if these occur.    Vital signs reviewed and are as follows: BP (!) 137/104 (BP Location: Right Arm)   Pulse (!) 102   Temp 97.7 F (36.5 C) (Oral)   Resp 16   Ht 6\' 3"  (1.905 m)   Wt 77 kg   SpO2 99%   BMI 21.22 kg/m     Final Clinical Impressions(s) / ED Diagnoses   Final diagnoses:  Encounter for staple removal   Patient 14 days out from head wound, healing well. Here for suture removal.   ED Discharge Orders    None       Renne Crigler, Cordelia Poche 03/02/18 [id number]    Dione Booze, MD 03/02/18 [phone]

## 2018-03-02 NOTE — ED Triage Notes (Signed)
States he is here to have staples removed form his head, states they were put in on the 8/13

## 2018-03-02 NOTE — Discharge Instructions (Signed)
Please read and follow all provided instructions.  Your diagnoses today include:  1. Encounter for staple removal     Tests performed today include:  Vital signs. See below for your results today.   Medications prescribed:   None  Home care instructions:  Follow any educational materials contained in this packet.  Follow-up instructions: Please follow-up with your primary care provider as needed for further evaluation of your symptoms.  Return instructions:   Please return to the Emergency Department if you experience worsening symptoms.   Please return if you have any other emergent concerns.  Additional Information:  Your vital signs today were: BP (!) 137/104 (BP Location: Right Arm)    Pulse (!) 102    Temp 97.7 F (36.5 C) (Oral)    Resp 16    Ht 6\' 3"  (1.905 m)    Wt 77 kg    SpO2 99%    BMI 21.22 kg/m  If your blood pressure (BP) was elevated above 135/85 this visit, please have this repeated by your doctor within one month. ---------------

## 2018-03-10 ENCOUNTER — Emergency Department (HOSPITAL_COMMUNITY)
Admission: EM | Admit: 2018-03-10 | Discharge: 2018-03-10 | Disposition: A | Discharge status: Home / Self Care | Payer: Non-veteran care | Attending: Emergency Medicine | Admitting: Emergency Medicine

## 2018-03-10 ENCOUNTER — Encounter (HOSPITAL_COMMUNITY): Payer: Self-pay | Admitting: Emergency Medicine

## 2018-03-10 DIAGNOSIS — Z79899 Other long term (current) drug therapy: Secondary | ICD-10-CM | POA: Insufficient documentation

## 2018-03-10 DIAGNOSIS — H5789 Other specified disorders of eye and adnexa: Secondary | ICD-10-CM | POA: Diagnosis present

## 2018-03-10 DIAGNOSIS — H1032 Unspecified acute conjunctivitis, left eye: Secondary | ICD-10-CM | POA: Insufficient documentation

## 2018-03-10 DIAGNOSIS — Z87891 Personal history of nicotine dependence: Secondary | ICD-10-CM | POA: Diagnosis not present

## 2018-03-10 MED ORDER — TETRACAINE HCL 0.5 % OP SOLN
2.0000 [drp] | Freq: Once | OPHTHALMIC | Status: AC
  Administered 2018-03-10: 2 [drp] via OPHTHALMIC
  Filled 2018-03-10: qty 4

## 2018-03-10 MED ORDER — FLUORESCEIN SODIUM 1 MG OP STRP
1.0000 | ORAL_STRIP | Freq: Once | OPHTHALMIC | Status: AC
Start: 2018-03-10 — End: 2018-03-10
  Administered 2018-03-10: 1 via OPHTHALMIC
  Filled 2018-03-10: qty 1

## 2018-03-10 MED ORDER — ERYTHROMYCIN 5 MG/GM OP OINT
1.0000 "application " | TOPICAL_OINTMENT | Freq: Once | OPHTHALMIC | Status: AC
  Administered 2018-03-10: 1 via OPHTHALMIC
  Filled 2018-03-10: qty 3.5

## 2018-03-10 NOTE — ED Provider Notes (Signed)
[facility] EMERGENCY DEPARTMENT Provider Note   CSN: [id number] Arrival date & time: 03/10/18  1332     History   Chief Complaint Chief Complaint  Patient presents with  . Conjunctivitis    HPI Mark [name] is a 52 y.o. male.  Mark [name] is a 52 y.o. Male with history of ADHD, who presents to the emergency department for evaluation of left eye redness with yellow-green discharge for the past 4 days.  Patient reports he is not sure what caused symptoms to start, he does not recount any injury to the eye.  He does not wear contacts or glasses.  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  For the past 4 days he has had redness and increasing amounts of yellow-green discharge, this morning when he got up his eye was matted shut.  He reports the eye feels irritated and burns.  He reports his vision occasionally feels blurry but has had no severe vision change or disturbance.  Mild photophobia.  No pain with extraocular movements, the area around the eye has not been swelling or bulging.  Patient has not noted any fevers or chills.  He has not done anything to treat his symptoms prior to arrival, no other aggravating or alleviating factors.  No prior history of eye infections, no known sick contacts.     Past Medical History:  Diagnosis Date  . ADHD (attention deficit hyperactivity disorder)     Patient Active Problem List   Diagnosis Date Noted  . Other allergic rhinitis 05/09/2015    Past Surgical History:  Procedure Laterality Date  . NO PAST SURGERIES          Home Medications    Prior to Admission medications   Medication Sig Start Date End Date Taking? Authorizing Provider  ARTIFICIAL TEAR OP Apply 2-3 drops to eye daily as needed (Dryness).    [provider]  Azelastine-Fluticasone 137-50 MCG/ACT SUSP Place 1 spray into the nose 2 (two) times daily. 05/09/15   Bobbitt, Heywood Iles, MD    levocetirizine (XYZAL) 5 MG tablet Take 1 tablet (5 mg total) by mouth every evening. Patient not taking: Reported on 02/16/2018 05/09/15   Bobbitt, Heywood Iles, MD  loratadine (CLARITIN) 10 MG tablet Take 10 mg by mouth daily.    [provider]  Tetrahydrozoline HCl (VISINE OP) Place 2-3 drops into both ears daily as needed (Redness).    [provider]    Family History Family History  Problem Relation Age of Onset  . Allergic rhinitis Mother   . Allergic rhinitis Sister     Social History Social History   Tobacco Use  . Smoking status: Former Smoker    Last attempt to quit: 05/07/2014    Years since quitting: 3.8  . Smokeless tobacco: Never Used  Substance Use Topics  . Alcohol use: Yes    Alcohol/week: 0.0 standard drinks  . Drug use: Never     Allergies   Patient has no known allergies.   Review of Systems Review of Systems  Constitutional: Negative for chills and fever.  HENT: Negative.  Negative for facial swelling.   Eyes: Positive for photophobia, pain, discharge and redness. Negative for visual disturbance.  Skin: Negative for color change and rash.  Neurological: Negative for dizziness, light-headedness and headaches.     Physical Exam Updated Vital Signs BP 119/86 (BP Location: Right Arm)   Pulse 87   Temp 99.8 F (  37.7 C) (Oral)   Resp 18   SpO2 99%   Physical Exam  Constitutional: He appears well-developed and well-nourished. No distress.  HENT:  Head: Normocephalic and atraumatic.  Eyes: Right eye exhibits no discharge. Left eye exhibits discharge.  Visual Acuity Right Eye Distance: 20/25 Left Eye Distance: 20/25 Bilateral Distance: 20/20 Left conjunctivae is erythematous with mild erythema and swelling of the lower lid, yellowish discharge present on exam, no evidence of foreign body.  PERRLA without consensual pain, EOMs intact and nonpainful.  No periorbital swelling or induration, no proptosis.  Fluorescein staining  reveals no evidence of corneal abrasion or dendritic staining. The right eye is unremarkable.  Pulmonary/Chest: Effort normal. No respiratory distress.  Neurological: He is alert. Coordination normal.  Skin: He is not diaphoretic.  Psychiatric: He has a normal mood and affect. His behavior is normal.  Nursing note and vitals reviewed.    ED Treatments / Results  Labs (all labs ordered are listed, but only abnormal results are displayed) Labs Reviewed - No data to display  EKG None  Radiology No results found.  Procedures Procedures (including critical care time)  Medications Ordered in ED Medications  tetracaine (PONTOCAINE) 0.5 % ophthalmic solution 2 drop (2 drops Left Eye Given 03/10/18 1600)  fluorescein ophthalmic strip 1 strip (1 strip Left Eye Given 03/10/18 1600)  erythromycin ophthalmic ointment 1 application (1 application Left Eye Given 03/10/18 1641)     Initial Impression / Assessment and Plan / ED Course  I have reviewed the triage vital signs and the nursing notes.  Pertinent labs & imaging results that were available during my care of the patient were reviewed by me and considered in my medical decision making (see chart for details).  Mark [name] presents with symptoms consistent with bacterial conjunctivitis.  Purulent discharge on exam.  No corneal abrasions, entrapment, consensual photophobia, or dendritic staining with fluorescein study.  Presentation non-concerning for iritis, corneal abrasions, or HSV.  No evidence of preseptal or orbital cellulitis.  Pt is not a contact lens wearer.  Patient will be given erythromycin ophthalmic.  Personal hygiene and frequent handwashing discussed.  Patient advised to followup with ophthalmologist for reevaluation in several days.  Patient verbalizes understanding and is agreeable with discharge.  Final Clinical Impressions(s) / ED Diagnoses   Final diagnoses:  Acute conjunctivitis of left eye, unspecified acute  conjunctivitis type    ED Discharge Orders    None       Dartha Lodge, [place] 03/10/18 1740    Melene Plan, DO 03/10/18 2054

## 2018-03-10 NOTE — Discharge Instructions (Addendum)
Use antibiotic ointment 3-4 times daily for the next 7 days.  Make sure you are washing your hands frequently.  Symptoms are not improving in the next 2 to 3 days or you develop fevers, significantly worsening swelling of the eye, worsening eye pain, changes in vision or any other new or concerning symptoms please follow-up with ophthalmology.

## 2018-03-10 NOTE — ED Triage Notes (Signed)
Pt to ER for evaluation of left eye redness with yellow/green drainage x4 days.

## 2018-07-28 ENCOUNTER — Emergency Department (HOSPITAL_COMMUNITY)
Admission: EM | Admit: 2018-07-28 | Discharge: 2018-07-28 | Disposition: A | Discharge status: Home / Self Care | Payer: No Typology Code available for payment source | Attending: Emergency Medicine | Admitting: Emergency Medicine

## 2018-07-28 ENCOUNTER — Encounter (HOSPITAL_COMMUNITY): Payer: Self-pay | Admitting: Emergency Medicine

## 2018-07-28 DIAGNOSIS — M7022 Olecranon bursitis, left elbow: Secondary | ICD-10-CM | POA: Insufficient documentation

## 2018-07-28 DIAGNOSIS — F909 Attention-deficit hyperactivity disorder, unspecified type: Secondary | ICD-10-CM | POA: Insufficient documentation

## 2018-07-28 DIAGNOSIS — M25529 Pain in unspecified elbow: Secondary | ICD-10-CM | POA: Diagnosis present

## 2018-07-28 DIAGNOSIS — Y939 Activity, unspecified: Secondary | ICD-10-CM | POA: Insufficient documentation

## 2018-07-28 MED ORDER — IBUPROFEN 400 MG PO TABS
600.0000 mg | ORAL_TABLET | Freq: Once | ORAL | Status: AC
  Administered 2018-07-28: 600 mg via ORAL
  Filled 2018-07-28: qty 1

## 2018-07-28 NOTE — ED Provider Notes (Signed)
[facility] EMERGENCY DEPARTMENT Provider Note   CSN: [id number] Arrival date & time: 07/28/18  0725   History   Chief Complaint Chief Complaint  Patient presents with  . Joint Swelling    HPI PHINN STAUFF is a 53 y.o. male.  HPI   54 year old male presents today with complaints of elbow pain.  Patient notes approximately 2 weeks ago he developed pain to his olecranon process.  He notes it swelled up and has been improving.  He still notes some pain at the elbow but denies any significant fever overlying redness or any other complaints.  Patient notes he has had a history of the same in the right elbow.  Patient notes taking tumor rec and 1 dose of ibuprofen in the morning for inflammation.  He denies any other systemic illnesses.  Patient notes that he took his Adderall and coffee this morning.  Past Medical History:  Diagnosis Date  . ADHD (attention deficit hyperactivity disorder)     Patient Active Problem List   Diagnosis Date Noted  . Other allergic rhinitis 05/09/2015    Past Surgical History:  Procedure Laterality Date  . NO PAST SURGERIES        Home Medications    Prior to Admission medications   Medication Sig Start Date End Date Taking? Authorizing Provider  ARTIFICIAL TEAR OP Apply 2-3 drops to eye daily as needed (Dryness).    [provider]  Azelastine-Fluticasone 137-50 MCG/ACT SUSP Place 1 spray into the nose 2 (two) times daily. 05/09/15   Bobbitt, Heywood Iles, MD  levocetirizine (XYZAL) 5 MG tablet Take 1 tablet (5 mg total) by mouth every evening. Patient not taking: Reported on 02/16/2018 05/09/15   Bobbitt, Heywood Iles, MD  loratadine (CLARITIN) 10 MG tablet Take 10 mg by mouth daily.    [provider]  Tetrahydrozoline HCl (VISINE OP) Place 2-3 drops into both ears daily as needed (Redness).    [provider]    Family History Family History  Problem Relation Age of Onset  . Allergic  rhinitis Mother   . Allergic rhinitis Sister     Social History Social History   Tobacco Use  . Smoking status: Former Smoker    Last attempt to quit: 05/07/2014    Years since quitting: 4.2  . Smokeless tobacco: Never Used  Substance Use Topics  . Alcohol use: Yes    Alcohol/week: 0.0 standard drinks  . Drug use: Never    Allergies   Patient has no known allergies.   Review of Systems Review of Systems  All other systems reviewed and are negative.  Physical Exam Updated Vital Signs BP 127/90   Pulse (!) 128   Temp 97.8 F (36.6 C) (Oral)   Resp 15   SpO2 100%   Physical Exam Vitals signs and nursing note reviewed.  Constitutional:      Appearance: He is well-developed.  HENT:     Head: Normocephalic and atraumatic.  Eyes:     General: No scleral icterus.       Right eye: No discharge.        Left eye: No discharge.     Conjunctiva/sclera: Conjunctivae normal.     Pupils: Pupils are equal, round, and reactive to light.  Neck:     Musculoskeletal: Normal range of motion.     Vascular: No JVD.     Trachea: No tracheal deviation.  Pulmonary:     Effort: Pulmonary effort is normal.  Breath sounds: No stridor.  Musculoskeletal:     Comments: Left elbow with very minor inflammation along the olecranon bursa, no warmth to touch, overlying redness, full active range of motion of the elbow  Neurological:     Mental Status: He is alert and oriented to person, place, and time.     Coordination: Coordination normal.  Psychiatric:        Behavior: Behavior normal.        Thought Content: Thought content normal.        Judgment: Judgment normal.     ED Treatments / Results  Labs (all labs ordered are listed, but only abnormal results are displayed) Labs Reviewed - No data to display  EKG None  Radiology No results found.  Procedures Procedures (including critical care time)  Medications Ordered in ED Medications  ibuprofen (ADVIL,MOTRIN) tablet  600 mg (has no administration in time range)     Initial Impression / Assessment and Plan / ED Course  I have reviewed the triage vital signs and the nursing notes.  Pertinent labs & imaging results that were available during my care of the patient were reviewed by me and considered in my medical decision making (see chart for details).       Assessment/Plan: 53 year old male presents today with bursitis, very low suspicion for infectious as symptoms are improving with no signs of redness warmth or fever.  Patient is tachycardic here I have high suspicion this is secondary to Adderall and coffee prior to evaluation.  He has no signs of infection or other concerning signs or symptoms.  Patient will continue monitoring his heart rate at home, follow-up immediately if develops any new or worsening signs or symptoms.  Patient verbalized understanding and agreement to today's plan had no further questions or concerns.     Final Clinical Impressions(s) / ED Diagnoses   Final diagnoses:  Olecranon bursitis of left elbow    ED Discharge Orders    None       Rosalio LoudHedges, Joene Gelder, PA-C 07/28/18 0805    Melene PlanFloyd, Dan, DO 07/28/18 1558

## 2018-07-28 NOTE — Discharge Instructions (Signed)
Please read attached information. If you experience any new or worsening signs or symptoms please return to the emergency room for evaluation. Please follow-up with your primary care provider or specialist as discussed.  °

## 2018-07-28 NOTE — ED Triage Notes (Addendum)
Pt reports pain and swelling to L elbow, hx of bursitis. Denies any known injury or trauma. No fever or chills, tachycardic in triage with rate of 120

## 2018-11-12 ENCOUNTER — Encounter (HOSPITAL_COMMUNITY): Payer: Self-pay | Admitting: Emergency Medicine

## 2018-11-12 ENCOUNTER — Other Ambulatory Visit: Payer: Self-pay

## 2018-11-12 ENCOUNTER — Emergency Department (HOSPITAL_COMMUNITY)
Admission: EM | Admit: 2018-11-12 | Discharge: 2018-11-12 | Disposition: A | Discharge status: Home / Self Care | Payer: No Typology Code available for payment source | Attending: Emergency Medicine | Admitting: Emergency Medicine

## 2018-11-12 ENCOUNTER — Emergency Department (HOSPITAL_COMMUNITY): Payer: No Typology Code available for payment source

## 2018-11-12 DIAGNOSIS — Y939 Activity, unspecified: Secondary | ICD-10-CM | POA: Insufficient documentation

## 2018-11-12 DIAGNOSIS — Z87891 Personal history of nicotine dependence: Secondary | ICD-10-CM | POA: Insufficient documentation

## 2018-11-12 DIAGNOSIS — Y999 Unspecified external cause status: Secondary | ICD-10-CM | POA: Insufficient documentation

## 2018-11-12 DIAGNOSIS — Y92410 Unspecified street and highway as the place of occurrence of the external cause: Secondary | ICD-10-CM | POA: Diagnosis not present

## 2018-11-12 DIAGNOSIS — S93602A Unspecified sprain of left foot, initial encounter: Secondary | ICD-10-CM | POA: Insufficient documentation

## 2018-11-12 DIAGNOSIS — Z79899 Other long term (current) drug therapy: Secondary | ICD-10-CM | POA: Insufficient documentation

## 2018-11-12 DIAGNOSIS — Z23 Encounter for immunization: Secondary | ICD-10-CM | POA: Insufficient documentation

## 2018-11-12 DIAGNOSIS — S93402A Sprain of unspecified ligament of left ankle, initial encounter: Secondary | ICD-10-CM | POA: Insufficient documentation

## 2018-11-12 DIAGNOSIS — S99922A Unspecified injury of left foot, initial encounter: Secondary | ICD-10-CM | POA: Diagnosis present

## 2018-11-12 MED ORDER — TETANUS-DIPHTH-ACELL PERTUSSIS 5-2.5-18.5 LF-MCG/0.5 IM SUSP
0.5000 mL | Freq: Once | INTRAMUSCULAR | Status: AC
  Administered 2018-11-12: 0.5 mL via INTRAMUSCULAR
  Filled 2018-11-12: qty 0.5

## 2018-11-12 NOTE — Discharge Instructions (Signed)
Please return for any problem.    Follow-up with your regular care provider as instructed.  Follow up with orthopedics as instructed. 
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
## 2019-10-23 ENCOUNTER — Emergency Department (HOSPITAL_COMMUNITY)
Admission: EM | Admit: 2019-10-23 | Discharge: 2019-10-23 | Disposition: A | Discharge status: Home / Self Care | Payer: No Typology Code available for payment source | Attending: Emergency Medicine | Admitting: Emergency Medicine

## 2019-10-23 ENCOUNTER — Other Ambulatory Visit: Payer: Self-pay

## 2019-10-23 ENCOUNTER — Encounter (HOSPITAL_COMMUNITY): Payer: Self-pay | Admitting: Emergency Medicine

## 2019-10-23 DIAGNOSIS — Z87891 Personal history of nicotine dependence: Secondary | ICD-10-CM | POA: Insufficient documentation

## 2019-10-23 DIAGNOSIS — L539 Erythematous condition, unspecified: Secondary | ICD-10-CM | POA: Insufficient documentation

## 2019-10-23 DIAGNOSIS — L299 Pruritus, unspecified: Secondary | ICD-10-CM | POA: Diagnosis not present

## 2019-10-23 DIAGNOSIS — R21 Rash and other nonspecific skin eruption: Secondary | ICD-10-CM | POA: Diagnosis present

## 2019-10-23 DIAGNOSIS — F909 Attention-deficit hyperactivity disorder, unspecified type: Secondary | ICD-10-CM | POA: Insufficient documentation

## 2019-10-23 MED ORDER — PREDNISONE 10 MG PO TABS: 40.0000 mg | ORAL_TABLET | Freq: Every day | ORAL | 0 refills | Status: DC

## 2019-10-23 MED ORDER — PREDNISONE 20 MG PO TABS
60.0000 mg | ORAL_TABLET | Freq: Once | ORAL | Status: AC
  Administered 2019-10-23: 21:00:00 60 mg via ORAL
  Filled 2019-10-23: qty 3

## 2019-10-23 NOTE — ED Triage Notes (Signed)
C/o generalized rash all over for over a month.  Pt believes it is related to the [employer] Vaccine he received on 3/9.

## 2019-10-23 NOTE — Discharge Instructions (Addendum)
Trial of the prednisone for the next 5 days.  Then follow-up with current of the [place] clinic.  Since your blood work was normal at the [place] according to you we do not need to do any blood work here today.  But you probably will need follow-up with dermatology through the [facility] system.

## 2019-10-23 NOTE — ED Provider Notes (Signed)
[facility] EMERGENCY DEPARTMENT Provider Note   CSN: [id number] Arrival date & time: 10/23/19  1518     History Chief Complaint  Patient presents with  . Rash    Mark [name] is a 54 y.o. male.  Patient presenting with 1 month history of somewhat vesicular very itchy erythematous patchy rash.  Patient had the [place] vaccine March 9.  Patient is concerned is related.  Patient seen this week at [facility] clinic.  They gave him topical steroids to place on it.  And did blood work according to the patient which was normal.  Patient without any other symptoms.  Is never had anything like this before.  It involves his lower extremities his upper extremities buttocks area.  And some on the trunk area.  No fevers no shortness of breath no abdominal pain no chest pain no bleeding.        Past Medical History:  Diagnosis Date  . ADHD (attention deficit hyperactivity disorder)     Patient Active Problem List   Diagnosis Date Noted  . Other allergic rhinitis 05/09/2015    Past Surgical History:  Procedure Laterality Date  . NO PAST SURGERIES         Family History  Problem Relation Age of Onset  . Allergic rhinitis Mother   . Allergic rhinitis Sister     Social History   Tobacco Use  . Smoking status: Former Smoker    Quit date: 05/07/2014    Years since quitting: 5.4  . Smokeless tobacco: Never Used  Substance Use Topics  . Alcohol use: Yes    Alcohol/week: 0.0 standard drinks  . Drug use: Never    Home Medications Prior to Admission medications   Medication Sig Start Date End Date Taking? Authorizing Provider  ARTIFICIAL TEAR OP Apply 2-3 drops to eye daily as needed (Dryness).    [provider]  Azelastine-Fluticasone 137-50 MCG/ACT SUSP Place 1 spray into the nose 2 (two) times daily. 05/09/15   Bobbitt, Sed[place] Muta, MD  levocetirizine (XYZAL) 5 MG tablet Take 1 tablet (5 mg total) by mouth every  evening. Patient not taking: Reported on 02/16/2018 05/09/15   Bobbitt, [place] Muta, MD  loratadine (CLARITIN) 10 MG tablet Take 10 mg by mouth daily.    [provider]  predniSONE (DELTASONE) 10 MG tablet Take 4 tablets (40 mg total) by mouth daily. 10/23/19   Fredia Sorrow, MD  Tetrahydrozoline HCl (VISINE OP) Place 2-3 drops into both ears daily as needed (Redness).    [provider]    Allergies    Patient has no known allergies.  Review of Systems   Review of Systems  Constitutional: Negative for chills and fever.  HENT: Negative for congestion, rhinorrhea and sore throat.   Eyes: Negative for visual disturbance.  Respiratory: Negative for cough and shortness of breath.   Cardiovascular: Negative for chest pain and leg swelling.  Gastrointestinal: Negative for abdominal pain, diarrhea, nausea and vomiting.  Genitourinary: Negative for dysuria.  Musculoskeletal: Negative for back pain and neck pain.  Skin: Positive for rash.  Neurological: Negative for dizziness, light-headedness and headaches.  Hematological: Does not bruise/bleed easily.  Psychiatric/Behavioral: Negative for confusion.    Physical Exam Updated Vital Signs BP (!) 134/102 (BP Location: Left Arm)   Pulse 85   Temp 98.2 F (36.8 C) (Oral)   Resp 16   SpO2 96%   Physical Exam Vitals and nursing note reviewed.  Constitutional:  Appearance: Normal appearance. He is well-developed.  HENT:     Head: Normocephalic and atraumatic.  Eyes:     Extraocular Movements: Extraocular movements intact.     Conjunctiva/sclera: Conjunctivae normal.     Pupils: Pupils are equal, round, and reactive to light.  Cardiovascular:     Rate and Rhythm: Normal rate and regular rhythm.     Heart sounds: No murmur.  Pulmonary:     Effort: Pulmonary effort is normal. No respiratory distress.     Breath sounds: Normal breath sounds.  Abdominal:     Palpations: Abdomen is soft.     Tenderness: There is  no abdominal tenderness.  Musculoskeletal:        General: Normal range of motion.     Cervical back: Normal range of motion and neck supple.  Skin:    General: Skin is warm and dry.     Capillary Refill: Capillary refill takes less than 2 seconds.     Findings: Erythema and rash present.     Comments: Spherical and patchy areas of little bit of a early fascicular rash.  With an erythematous base.  No hives.  No open blisters.  Scattered throughout lower extremities up around the buttocks area some on the upper extremities little bit on the trunk.  Neurological:     General: No focal deficit present.     Mental Status: He is alert and oriented to person, place, and time.     Cranial Nerves: No cranial nerve deficit.     Sensory: No sensory deficit.     Motor: No weakness.     ED Results / Procedures / Treatments   Labs (all labs ordered are listed, but only abnormal results are displayed) Labs Reviewed - No data to display  EKG None  Radiology No results found.  Procedures Procedures (including critical care time)  Medications Ordered in ED Medications  predniSONE (DELTASONE) tablet 60 mg (has no administration in time range)    ED Course  I have reviewed the triage vital signs and the nursing notes.  Pertinent labs & imaging results that were available during my care of the patient were reviewed by me and considered in my medical decision making (see chart for details).    MDM Rules/Calculators/A&P                     Rash not clear exactly what is: Has somewhat of a contact dermatitis appearance.  Not consistent with herpes zoster.  Not really consistent with a fungal infection.  No really any open blisters.  Patient nontoxic no acute distress.  [facility].  They gave him topical steroids.  We will try a course of oral steroids and have him follow back up with the [place].  Most likely is going to need to be seen by dermatology.  Clinically think  it is unlikely is related to the vaccine.  I offered to do labs again but patient did not want them to be done again.  Final Clinical Impression(s) / ED Diagnoses Final diagnoses:  Rash    Rx / DC Orders ED Discharge Orders         Ordered    predniSONE (DELTASONE) 10 MG tablet  Daily     10/23/19 2100           Vanetta Mulders, MD 10/23/19 2108

## 2020-12-20 ENCOUNTER — Other Ambulatory Visit: Payer: Self-pay

## 2020-12-20 ENCOUNTER — Emergency Department (HOSPITAL_COMMUNITY): Payer: No Typology Code available for payment source

## 2020-12-20 ENCOUNTER — Emergency Department (HOSPITAL_COMMUNITY)
Admission: EM | Admit: 2020-12-20 | Discharge: 2020-12-20 | Disposition: A | Discharge status: Home / Self Care | Payer: No Typology Code available for payment source | Attending: Emergency Medicine | Admitting: Emergency Medicine

## 2020-12-20 DIAGNOSIS — M542 Cervicalgia: Secondary | ICD-10-CM | POA: Insufficient documentation

## 2020-12-20 DIAGNOSIS — F10920 Alcohol use, unspecified with intoxication, uncomplicated: Secondary | ICD-10-CM

## 2020-12-20 DIAGNOSIS — S0990XA Unspecified injury of head, initial encounter: Secondary | ICD-10-CM | POA: Diagnosis not present

## 2020-12-20 DIAGNOSIS — F10129 Alcohol abuse with intoxication, unspecified: Secondary | ICD-10-CM | POA: Insufficient documentation

## 2020-12-20 DIAGNOSIS — E876 Hypokalemia: Secondary | ICD-10-CM

## 2020-12-20 DIAGNOSIS — S0081XA Abrasion of other part of head, initial encounter: Secondary | ICD-10-CM | POA: Diagnosis not present

## 2020-12-20 DIAGNOSIS — S80819A Abrasion, unspecified lower leg, initial encounter: Secondary | ICD-10-CM | POA: Diagnosis not present

## 2020-12-20 DIAGNOSIS — R000 Tachycardia, unspecified: Secondary | ICD-10-CM | POA: Insufficient documentation

## 2020-12-20 DIAGNOSIS — T148XXA Other injury of unspecified body region, initial encounter: Secondary | ICD-10-CM

## 2020-12-20 DIAGNOSIS — F1410 Cocaine abuse, uncomplicated: Secondary | ICD-10-CM

## 2020-12-20 DIAGNOSIS — Y92009 Unspecified place in unspecified non-institutional (private) residence as the place of occurrence of the external cause: Secondary | ICD-10-CM | POA: Insufficient documentation

## 2020-12-20 DIAGNOSIS — S0993XA Unspecified injury of face, initial encounter: Secondary | ICD-10-CM | POA: Diagnosis present

## 2020-12-20 DIAGNOSIS — Y901 Blood alcohol level of 20-39 mg/100 ml: Secondary | ICD-10-CM | POA: Insufficient documentation

## 2020-12-20 DIAGNOSIS — Z23 Encounter for immunization: Secondary | ICD-10-CM | POA: Insufficient documentation

## 2020-12-20 DIAGNOSIS — Z87891 Personal history of nicotine dependence: Secondary | ICD-10-CM | POA: Diagnosis not present

## 2020-12-20 DIAGNOSIS — H1133 Conjunctival hemorrhage, bilateral: Secondary | ICD-10-CM | POA: Diagnosis not present

## 2020-12-20 LAB — CBC WITH DIFFERENTIAL/PLATELET
Abs Immature Granulocytes: 0.1 K/uL — ABNORMAL HIGH (ref 0.00–0.07)
Basophils Absolute: 0 K/uL (ref 0.0–0.1)
Basophils Relative: 0 %
Eosinophils Absolute: 0 K/uL (ref 0.0–0.5)
Eosinophils Relative: 0 %
HCT: 36.6 % — ABNORMAL LOW (ref 39.0–52.0)
Hemoglobin: 12.8 g/dL — ABNORMAL LOW (ref 13.0–17.0)
Immature Granulocytes: 1 %
Lymphocytes Relative: 4 %
Lymphs Abs: 0.6 K/uL — ABNORMAL LOW (ref 0.7–4.0)
MCH: 36.9 pg — ABNORMAL HIGH (ref 26.0–34.0)
MCHC: 35 g/dL (ref 30.0–36.0)
MCV: 105.5 fL — ABNORMAL HIGH (ref 80.0–100.0)
Monocytes Absolute: 1 K/uL (ref 0.1–1.0)
Monocytes Relative: 7 %
Neutro Abs: 11.9 K/uL — ABNORMAL HIGH (ref 1.7–7.7)
Neutrophils Relative %: 88 %
Platelets: 128 K/uL — ABNORMAL LOW (ref 150–400)
RBC: 3.47 MIL/uL — ABNORMAL LOW (ref 4.22–5.81)
RDW: 13.4 % (ref 11.5–15.5)
WBC: 13.6 K/uL — ABNORMAL HIGH (ref 4.0–10.5)
nRBC: 0 % (ref 0.0–0.2)

## 2020-12-20 LAB — COMPREHENSIVE METABOLIC PANEL
ALT: 29 U/L (ref 0–44)
AST: 79 U/L — ABNORMAL HIGH (ref 15–41)
Albumin: 3.7 g/dL (ref 3.5–5.0)
Alkaline Phosphatase: 81 U/L (ref 38–126)
Anion gap: 18 — ABNORMAL HIGH (ref 5–15)
BUN: 6 mg/dL (ref 6–20)
CO2: 17 mmol/L — ABNORMAL LOW (ref 22–32)
Calcium: 9 mg/dL (ref 8.9–10.3)
Chloride: 98 mmol/L (ref 98–111)
Creatinine, Ser: 0.8 mg/dL (ref 0.61–1.24)
GFR, Estimated: >60 mL/min (ref >60)
Glucose, Bld: 122 mg/dL — ABNORMAL HIGH (ref 70–99)
Potassium: 3.1 mmol/L — ABNORMAL LOW (ref 3.5–5.1)
Sodium: 133 mmol/L — ABNORMAL LOW (ref 135–145)
Total Bilirubin: 2.2 mg/dL — ABNORMAL HIGH (ref 0.3–1.2)
Total Protein: 6.5 g/dL (ref 6.5–8.1)

## 2020-12-20 LAB — URINALYSIS, ROUTINE W REFLEX MICROSCOPIC
Bacteria, UA: NONE SEEN
Bilirubin Urine: NEGATIVE
Glucose, UA: 150 mg/dL — AB
Ketones, ur: 80 mg/dL — AB
Leukocytes,Ua: NEGATIVE
Nitrite: NEGATIVE
Protein, ur: 100 mg/dL — AB
Specific Gravity, Urine: 1.02 (ref 1.005–1.030)
pH: 5 (ref 5.0–8.0)

## 2020-12-20 LAB — CK: Total CK: 708 U/L — ABNORMAL HIGH (ref 49–397)

## 2020-12-20 LAB — RAPID URINE DRUG SCREEN, HOSP PERFORMED
Amphetamines: NOT DETECTED
Barbiturates: NOT DETECTED
Benzodiazepines: NOT DETECTED
Cocaine: POSITIVE — AB
Opiates: NOT DETECTED
Tetrahydrocannabinol: NOT DETECTED

## 2020-12-20 LAB — TROPONIN I (HIGH SENSITIVITY)
Troponin I (High Sensitivity): 22 ng/L — ABNORMAL HIGH
Troponin I (High Sensitivity): 20 ng/L — ABNORMAL HIGH (ref <18)

## 2020-12-20 LAB — ETHANOL: Alcohol, Ethyl (B): 33 mg/dL — ABNORMAL HIGH

## 2020-12-20 LAB — MAGNESIUM: Magnesium: 1.3 mg/dL — ABNORMAL LOW (ref 1.7–2.4)

## 2020-12-20 MED ORDER — POTASSIUM CHLORIDE CRYS ER 20 MEQ PO TBCR
20.0000 meq | EXTENDED_RELEASE_TABLET | Freq: Once | ORAL | Status: AC
  Administered 2020-12-20: 20 meq via ORAL
  Filled 2020-12-20: qty 1

## 2020-12-20 MED ORDER — MAGNESIUM SULFATE IN D5W 1-5 GM/100ML-% IV SOLN
1.0000 g | Freq: Once | INTRAVENOUS | Status: AC
  Administered 2020-12-20: 1 g via INTRAVENOUS
  Filled 2020-12-20: qty 100

## 2020-12-20 MED ORDER — TETANUS-DIPHTH-ACELL PERTUSSIS 5-2.5-18.5 LF-MCG/0.5 IM SUSY
0.5000 mL | PREFILLED_SYRINGE | Freq: Once | INTRAMUSCULAR | Status: AC
  Administered 2020-12-20: 0.5 mL via INTRAMUSCULAR
  Filled 2020-12-20: qty 0.5

## 2020-12-20 MED ORDER — KETOROLAC TROMETHAMINE 30 MG/ML IJ SOLN
30.0000 mg | Freq: Once | INTRAMUSCULAR | Status: AC
  Administered 2020-12-20: 30 mg via INTRAVENOUS
  Filled 2020-12-20: qty 1

## 2020-12-20 MED ORDER — SODIUM CHLORIDE 0.9 % IV BOLUS
1000.0000 mL | Freq: Once | INTRAVENOUS | Status: AC
  Administered 2020-12-20: 1000 mL via INTRAVENOUS

## 2020-12-20 MED ORDER — HYDROCORTISONE 1 % EX CREA: TOPICAL_CREAM | CUTANEOUS | 0 refills | Status: DC

## 2020-12-20 MED ORDER — POTASSIUM CHLORIDE 10 MEQ/100ML IV SOLN
10.0000 meq | Freq: Once | INTRAVENOUS | Status: AC
  Administered 2020-12-20: 10 meq via INTRAVENOUS
  Filled 2020-12-20: qty 100

## 2020-12-20 NOTE — ED Notes (Signed)
Patient transported to CT 
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
## 2020-12-20 NOTE — ED Notes (Signed)
Grey top and blue top sent to lab.
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
## 2020-12-20 NOTE — Discharge Instructions (Addendum)
You have been seen and discharged from the emergency department. Your CT imaging is negative. Follow-up with your primary provider for reevaluation and further care. Take home medications as prescribed. If you have any worsening symptoms or further concerns for your health please return to an emergency department for further evaluation.
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
## 2021-04-10 ENCOUNTER — Encounter (HOSPITAL_COMMUNITY): Payer: Self-pay | Admitting: Internal Medicine

## 2021-04-10 ENCOUNTER — Inpatient Hospital Stay (HOSPITAL_COMMUNITY)
Admission: EM | Admit: 2021-04-10 | Discharge: 2021-04-15 | DRG: 897 | Disposition: A | Discharge status: Home / Self Care | Payer: No Typology Code available for payment source | Attending: Internal Medicine | Admitting: Internal Medicine

## 2021-04-10 DIAGNOSIS — F10251 Alcohol dependence with alcohol-induced psychotic disorder with hallucinations: Secondary | ICD-10-CM | POA: Diagnosis present

## 2021-04-10 DIAGNOSIS — R45851 Suicidal ideations: Secondary | ICD-10-CM | POA: Diagnosis present

## 2021-04-10 DIAGNOSIS — F10939 Alcohol use, unspecified with withdrawal, unspecified: Secondary | ICD-10-CM | POA: Diagnosis not present

## 2021-04-10 DIAGNOSIS — R630 Anorexia: Secondary | ICD-10-CM | POA: Diagnosis present

## 2021-04-10 DIAGNOSIS — R739 Hyperglycemia, unspecified: Secondary | ICD-10-CM | POA: Diagnosis present

## 2021-04-10 DIAGNOSIS — F10930 Alcohol use, unspecified with withdrawal, uncomplicated: Secondary | ICD-10-CM

## 2021-04-10 DIAGNOSIS — R45850 Homicidal ideations: Secondary | ICD-10-CM | POA: Diagnosis present

## 2021-04-10 DIAGNOSIS — F10239 Alcohol dependence with withdrawal, unspecified: Secondary | ICD-10-CM | POA: Diagnosis not present

## 2021-04-10 DIAGNOSIS — F10959 Alcohol use, unspecified with alcohol-induced psychotic disorder, unspecified: Secondary | ICD-10-CM

## 2021-04-10 DIAGNOSIS — F1010 Alcohol abuse, uncomplicated: Secondary | ICD-10-CM | POA: Diagnosis present

## 2021-04-10 DIAGNOSIS — Z20822 Contact with and (suspected) exposure to covid-19: Secondary | ICD-10-CM | POA: Diagnosis present

## 2021-04-10 DIAGNOSIS — E876 Hypokalemia: Secondary | ICD-10-CM | POA: Diagnosis present

## 2021-04-10 DIAGNOSIS — Z87891 Personal history of nicotine dependence: Secondary | ICD-10-CM

## 2021-04-10 DIAGNOSIS — R5381 Other malaise: Secondary | ICD-10-CM | POA: Diagnosis present

## 2021-04-10 DIAGNOSIS — F1020 Alcohol dependence, uncomplicated: Secondary | ICD-10-CM

## 2021-04-10 DIAGNOSIS — G629 Polyneuropathy, unspecified: Secondary | ICD-10-CM | POA: Diagnosis present

## 2021-04-10 DIAGNOSIS — F909 Attention-deficit hyperactivity disorder, unspecified type: Secondary | ICD-10-CM | POA: Diagnosis present

## 2021-04-10 LAB — CBC WITH DIFFERENTIAL/PLATELET
Abs Immature Granulocytes: 0.25 10*3/uL — ABNORMAL HIGH (ref 0.00–0.07)
Basophils Absolute: 0 10*3/uL (ref 0.0–0.1)
Basophils Relative: 1 %
Eosinophils Absolute: 0.1 10*3/uL (ref 0.0–0.5)
Eosinophils Relative: 1 %
HCT: 33.3 % — ABNORMAL LOW (ref 39.0–52.0)
Hemoglobin: 12.4 g/dL — ABNORMAL LOW (ref 13.0–17.0)
Immature Granulocytes: 4 %
Lymphocytes Relative: 20 %
Lymphs Abs: 1.3 10*3/uL (ref 0.7–4.0)
MCH: 37.8 pg — ABNORMAL HIGH (ref 26.0–34.0)
MCHC: 37.2 g/dL — ABNORMAL HIGH (ref 30.0–36.0)
MCV: 101.5 fL — ABNORMAL HIGH (ref 80.0–100.0)
Monocytes Absolute: 1.3 10*3/uL — ABNORMAL HIGH (ref 0.1–1.0)
Monocytes Relative: 20 %
Neutro Abs: 3.7 10*3/uL (ref 1.7–7.7)
Neutrophils Relative %: 54 %
Platelets: 233 10*3/uL (ref 150–400)
RBC: 3.28 MIL/uL — ABNORMAL LOW (ref 4.22–5.81)
RDW: 12.6 % (ref 11.5–15.5)
WBC: 6.7 10*3/uL (ref 4.0–10.5)
nRBC: 0 % (ref 0.0–0.2)

## 2021-04-10 LAB — MAGNESIUM: Magnesium: 1.6 mg/dL — ABNORMAL LOW (ref 1.7–2.4)

## 2021-04-10 LAB — COMPREHENSIVE METABOLIC PANEL
ALT: 36 U/L (ref 0–44)
AST: 110 U/L — ABNORMAL HIGH (ref 15–41)
Albumin: 3.7 g/dL (ref 3.5–5.0)
Alkaline Phosphatase: 154 U/L — ABNORMAL HIGH (ref 38–126)
Anion gap: 13 (ref 5–15)
BUN: 15 mg/dL (ref 6–20)
CO2: 29 mmol/L (ref 22–32)
Calcium: 9.8 mg/dL (ref 8.9–10.3)
Chloride: 92 mmol/L — ABNORMAL LOW (ref 98–111)
Creatinine, Ser: 0.71 mg/dL (ref 0.61–1.24)
GFR, Estimated: >60 mL/min (ref >60)
Glucose, Bld: 164 mg/dL — ABNORMAL HIGH (ref 70–99)
Potassium: 2.8 mmol/L — ABNORMAL LOW (ref 3.5–5.1)
Sodium: 134 mmol/L — ABNORMAL LOW (ref 135–145)
Total Bilirubin: 1.7 mg/dL — ABNORMAL HIGH (ref 0.3–1.2)
Total Protein: 7.3 g/dL (ref 6.5–8.1)

## 2021-04-10 LAB — ETHANOL: Alcohol, Ethyl (B): <10 mg/dL (ref <10)

## 2021-04-10 LAB — PHOSPHORUS: Phosphorus: 1.3 mg/dL — ABNORMAL LOW (ref 2.5–4.6)

## 2021-04-10 LAB — LIPASE, BLOOD: Lipase: 35 U/L (ref 11–51)

## 2021-04-10 MED ORDER — POTASSIUM PHOSPHATES 15 MMOLE/5ML IV SOLN
15.0000 mmol | Freq: Once | INTRAVENOUS | Status: AC
  Administered 2021-04-10: 15 mmol via INTRAVENOUS
  Filled 2021-04-10: qty 5

## 2021-04-10 MED ORDER — MAGNESIUM SULFATE 2 GM/50ML IV SOLN
2.0000 g | Freq: Once | INTRAVENOUS | Status: AC
  Administered 2021-04-10: 2 g via INTRAVENOUS
  Filled 2021-04-10: qty 50

## 2021-04-10 MED ORDER — ACETAMINOPHEN 325 MG PO TABS
650.0000 mg | ORAL_TABLET | Freq: Four times a day (QID) | ORAL | Status: DC | PRN
  Administered 2021-04-10 – 2021-04-11 (×2): 650 mg via ORAL
  Filled 2021-04-10 (×2): qty 2

## 2021-04-10 MED ORDER — THIAMINE HCL 100 MG PO TABS
100.0000 mg | ORAL_TABLET | Freq: Every day | ORAL | Status: DC
  Administered 2021-04-11: 100 mg via ORAL
  Filled 2021-04-10: qty 1

## 2021-04-10 MED ORDER — LACTATED RINGERS IV SOLN: INTRAVENOUS | Status: DC

## 2021-04-10 MED ORDER — POTASSIUM CHLORIDE CRYS ER 20 MEQ PO TBCR
60.0000 meq | EXTENDED_RELEASE_TABLET | Freq: Once | ORAL | Status: AC
  Administered 2021-04-10: 60 meq via ORAL
  Filled 2021-04-10: qty 3

## 2021-04-10 MED ORDER — ACETAMINOPHEN 650 MG RE SUPP: 650.0000 mg | Freq: Four times a day (QID) | RECTAL | Status: DC | PRN

## 2021-04-10 MED ORDER — LORAZEPAM 1 MG PO TABS: 1.0000 mg | ORAL_TABLET | ORAL | Status: DC | PRN

## 2021-04-10 MED ORDER — LACTATED RINGERS IV BOLUS
1000.0000 mL | Freq: Once | INTRAVENOUS | Status: AC
  Administered 2021-04-10: 1000 mL via INTRAVENOUS

## 2021-04-10 MED ORDER — LORAZEPAM 2 MG/ML IJ SOLN: 1.0000 mg | INTRAMUSCULAR | Status: DC | PRN

## 2021-04-10 MED ORDER — ONDANSETRON HCL 4 MG/2ML IJ SOLN: 4.0000 mg | Freq: Four times a day (QID) | INTRAMUSCULAR | Status: DC | PRN

## 2021-04-10 MED ORDER — LORAZEPAM 2 MG/ML IJ SOLN
1.0000 mg | Freq: Once | INTRAMUSCULAR | Status: AC
  Administered 2021-04-10: 1 mg via INTRAVENOUS
  Filled 2021-04-10: qty 1

## 2021-04-10 MED ORDER — ONDANSETRON HCL 4 MG PO TABS
4.0000 mg | ORAL_TABLET | Freq: Four times a day (QID) | ORAL | Status: DC | PRN
  Administered 2021-04-14: 4 mg via ORAL
  Filled 2021-04-10: qty 1

## 2021-04-10 MED ORDER — FOLIC ACID 1 MG PO TABS
1.0000 mg | ORAL_TABLET | Freq: Every day | ORAL | Status: DC
  Administered 2021-04-10 – 2021-04-15 (×6): 1 mg via ORAL
  Filled 2021-04-10 (×6): qty 1

## 2021-04-10 MED ORDER — THIAMINE HCL 100 MG/ML IJ SOLN
100.0000 mg | Freq: Every day | INTRAMUSCULAR | Status: DC
  Administered 2021-04-10: 100 mg via INTRAVENOUS
  Filled 2021-04-10: qty 2

## 2021-04-10 MED ORDER — POTASSIUM CHLORIDE IN NACL 40-0.9 MEQ/L-% IV SOLN
INTRAVENOUS | Status: DC
  Filled 2021-04-10 (×2): qty 1000

## 2021-04-10 MED ORDER — LORAZEPAM 2 MG/ML IJ SOLN
0.0000 mg | INTRAMUSCULAR | Status: AC
  Administered 2021-04-10 – 2021-04-11 (×3): 2 mg via INTRAVENOUS
  Administered 2021-04-12: 1 mg via INTRAVENOUS
  Filled 2021-04-10 (×4): qty 1

## 2021-04-10 MED ORDER — LORAZEPAM 2 MG/ML IJ SOLN
0.0000 mg | Freq: Three times a day (TID) | INTRAMUSCULAR | Status: AC
  Administered 2021-04-12: 1 mg via INTRAVENOUS
  Filled 2021-04-10: qty 1

## 2021-04-10 MED ORDER — ADULT MULTIVITAMIN W/MINERALS CH
1.0000 | ORAL_TABLET | Freq: Every day | ORAL | Status: DC
  Administered 2021-04-10 – 2021-04-15 (×6): 1 via ORAL
  Filled 2021-04-10 (×6): qty 1

## 2021-04-10 NOTE — ED Provider Notes (Signed)
[facility]-EMERGENCY DEPT Provider Note   CSN: [id number] Arrival date & time: 04/10/21  7829     History Chief Complaint  Patient presents with   Alcohol Problem    Mark [name] is a 55 y.o. male.  55 year old male presents with concern for decreased oral intake secondary to alcohol use.  Patient states he drinks a sixpack of alcohol a day.  Denies any emesis or abdominal pain.  No fever or chills.  No SI or HI.  No other illicit drug use.  States that he has not been taking solid foods.  Family was concerned about this and that is why he is here he denies any complaints at this time      Past Medical History:  Diagnosis Date   ADHD (attention deficit hyperactivity disorder)     Patient Active Problem List   Diagnosis Date Noted   Other allergic rhinitis 05/09/2015    Past Surgical History:  Procedure Laterality Date   NO PAST SURGERIES         Family History  Problem Relation Age of Onset   Allergic rhinitis Mother    Allergic rhinitis Sister     Social History   Tobacco Use   Smoking status: Former    Types: Cigarettes    Quit date: 05/07/2014    Years since quitting: 6.9   Smokeless tobacco: Never  Substance Use Topics   Alcohol use: Yes    Alcohol/week: 0.0 standard drinks   Drug use: Never    Home Medications Prior to Admission medications   Medication Sig Start Date End Date Taking? Authorizing Provider  ARTIFICIAL TEAR OP Apply 2-3 drops to eye daily as needed (Dryness).    [provider]  Azelastine-Fluticasone 137-50 MCG/ACT SUSP Place 1 spray into the nose 2 (two) times daily. 05/09/15   Bobbitt, Heywood Iles, MD  hydrocortisone cream 1 % Apply to affected area 2 times daily 12/20/20   Horton, Danford Bad M, DO  levocetirizine (XYZAL) 5 MG tablet Take 1 tablet (5 mg total) by mouth every evening. Patient not taking: Reported on 02/16/2018 05/09/15   Bobbitt, Heywood Iles, MD  loratadine (CLARITIN) 10 MG tablet  Take 10 mg by mouth daily.    [provider]  predniSONE (DELTASONE) 10 MG tablet Take 4 tablets (40 mg total) by mouth daily. 10/23/19   Vanetta Mulders, MD  Tetrahydrozoline HCl (VISINE OP) Place 2-3 drops into both ears daily as needed (Redness).    [provider]    Allergies    Patient has no known allergies.  Review of Systems   Review of Systems  All other systems reviewed and are negative.  Physical Exam Updated Vital Signs BP 106/87   Pulse 93   Temp 98.1 F (36.7 C) (Oral)   Resp (!) 28   Ht 1.93 m (6\' 4" )   Wt 68 kg   SpO2 100%   BMI 18.26 kg/m   Physical Exam Vitals and nursing note reviewed.  Constitutional:      General: He is not in acute distress.    Appearance: Normal appearance. He is well-developed. He is not toxic-appearing.  HENT:     Head: Normocephalic and atraumatic.  Eyes:     General: Lids are normal.     Conjunctiva/sclera: Conjunctivae normal.     Pupils: Pupils are equal, round, and reactive to light.  Neck:     Thyroid: No thyroid mass.     Trachea: No tracheal deviation.  Cardiovascular:     Rate and Rhythm: Normal rate and regular rhythm.     Heart sounds: Normal heart sounds. No murmur heard.   No gallop.  Pulmonary:     Effort: Pulmonary effort is normal. No respiratory distress.     Breath sounds: Normal breath sounds. No stridor. No decreased breath sounds, wheezing, rhonchi or rales.  Abdominal:     General: There is no distension.     Palpations: Abdomen is soft.     Tenderness: There is no abdominal tenderness. There is no rebound.  Musculoskeletal:        General: No tenderness. Normal range of motion.     Cervical back: Normal range of motion and neck supple.  Skin:    General: Skin is warm and dry.     Findings: No abrasion or rash.  Neurological:     Mental Status: He is alert and oriented to person, place, and time. Mental status is at baseline.     GCS: GCS eye subscore is 4. GCS verbal subscore  is 5. GCS motor subscore is 6.     Cranial Nerves: Cranial nerves are intact. No cranial nerve deficit.     Sensory: No sensory deficit.     Motor: Motor function is intact.  Psychiatric:        Attention and Perception: Attention normal.        Speech: Speech normal.        Behavior: Behavior is withdrawn.        Thought Content: Thought content includes homicidal and suicidal ideation.    ED Results / Procedures / Treatments   Labs (all labs ordered are listed, but only abnormal results are displayed) Labs Reviewed  CBC WITH DIFFERENTIAL/PLATELET  COMPREHENSIVE METABOLIC PANEL  ETHANOL  LIPASE, BLOOD    EKG None  Radiology No results found.  Procedures Procedures   Medications Ordered in ED Medications  lactated ringers bolus 1,000 mL (has no administration in time range)  lactated ringers infusion (has no administration in time range)    ED Course  I have reviewed the triage vital signs and the nursing notes.  Pertinent labs & imaging results that were available during my care of the patient were reviewed by me and considered in my medical decision making (see chart for details).    MDM Rules/Calculators/A&P                           Patient given IV fluids here as well as had his hypokalemia treated.  Patient was very tremulous when standing up and became tachycardic.  His alcohol level is 0.  Suspect some evidence of alcohol withdrawal.  Will give IV Ativan here.  Will consult hospitalist for Final Clinical Impression(s) / ED Diagnoses Final diagnoses:  None    Rx / DC Orders ED Discharge Orders     None        Lorre Nick, MD 04/10/21 1241

## 2021-04-10 NOTE — ED Triage Notes (Signed)
Ems brings pt in due to family concerns of pt not eating. Family reports pt only has been drinking beer for the past week and states that he has not been eating food.

## 2021-04-10 NOTE — TOC CAGE-AID Note (Signed)
Transition of Care [facility]) - CAGE-AID Screening   Patient Details  Name: Mark [name] MRN: [id number] Date of Birth: [birth year]  Transition of Care [facility]) CM/SW Contact:    Joanne Chars, LCSW Phone Number: 04/10/2021, 7:26 PM   Clinical Narrative:  CSW met with pt to complete Cage aid.  Pt reports daily drinking of ETOH, 6 beers per day for the past 10 years, "off and on."  Pt denies any illegal drug use.  CSW referenced positive UDS for cocaine recently and pt reports this was one time use at a party, denies ongoing use.  Pt has had periods of sobriety for up to six months but always goes back to drinking.  Discussed the current withdrawals and medical impact on his body that his ETOH use is having.  Pt aware and agrees that he needs to stop his ETOH use.  Pt has tried AA in past, found it somewhat helpful.  Pt wants to quit drinking on his own but would be open to treatment if he finds that he cannot maintain sobriety. CSW provided list of outpt and residential treatment providers.  Pt reports he goes to the [facility] and asked if the [place] has substance abuse programs--CSW confirmed this and encouraged him to seek treatment there if he is comfortable.  Pt aware how to locate a local AA meeting as well.  Pt verbalized understanding of his options.       CAGE-AID Screening:    Have You Ever Felt You Ought to Cut Down on Your Drinking or Drug Use?: Yes Have People Annoyed You By Critizing Your Drinking Or Drug Use?: No Have You Felt Bad Or Guilty About Your Drinking Or Drug Use?: Yes Have You Ever Had a Drink or Used Drugs First Thing In The Morning to Steady Your Nerves or to Get Rid of a Hangover?: Yes CAGE-AID Score: 3  Substance Abuse Education Offered: Yes  Substance abuse interventions: Patient Counseling, Other (must comment) (substance abuse provider resource contact list)

## 2021-04-10 NOTE — H&P (Signed)
History and Physical    Mark [name] OBS:[id number] DOB: 11-01-1965 DOA: 04/10/2021  PCP: [facility]   Patient coming from: Home.  I have personally briefly reviewed patient's old medical records in [facility]  Chief Complaint: Not eating and alcohol abuse.  HPI: Mark [name] is a 55 y.o. male with medical history significant of ADHD, alcohol abuse who was brought to the emergency department via EMS after his sister and mother went to his residence along with the [place] [employer] to do a welfare check since according to his sister Valorie Roosevelt the patient has been very paranoid lately.  He has also been concerned because his alimony income from his divorce will be ending soon.  His sister stated that his house is in total disarray and she does not believe that he is capable of taking care of himself at this time.  He is currently sedated and unable to provide further information.  ED Course: Initial vital signs 98.1 F, pulse 99, respiration 17, BP 106/87 mmHg O2 sat 99% on room air.  The patient received 1 mg of lorazepam IVP, 1000 mL of LR bolus and 60 mEq of KCl p.o.  I added magnesium and potassium supplementation.  Lab work: His CBC showed a white count 6.7, hemoglobin 12.4 g/dL platelets 476.  Glucose 164, total bilirubin 1.7, magnesium 1.6 and phosphorus 1.3 mg/dL.  Sodium 134, potassium 2.8 and chloride 92 mmol/L.  Renal function, total protein and albumin level are normal.  AST was 110, ALT 36 and alkaline phosphatase 154.  Review of Systems: As per HPI otherwise all other systems reviewed and are negative.  Past Medical History:  Diagnosis Date   ADHD (attention deficit hyperactivity disorder)     Past Surgical History:  Procedure Laterality Date   NO PAST SURGERIES      Social History  reports that he quit smoking about 6 years ago. He has never used smokeless tobacco. He reports current alcohol use. He reports that he does not use  drugs.  No Known Allergies  Family History  Problem Relation Age of Onset   Allergic rhinitis Mother    Allergic rhinitis Sister    Prior to Admission medications   Medication Sig Start Date End Date Taking? Authorizing Provider  hydrocortisone cream 1 % Apply to affected area 2 times daily Patient not taking: Reported on 04/10/2021 12/20/20   Rozelle Logan, DO   Physical Exam: Vitals:   04/10/21 1015 04/10/21 1100 04/10/21 1200 04/10/21 1430  BP:  119/82 125/80 106/77  Pulse:  (!) 107 (!) 118 (!) 111  Resp:  19 (!) 26 18  Temp:      TempSrc:      SpO2:  100% 100% 100%  Weight: 68 kg     Height: 6\' 4"  (1.93 m)      Constitutional: Under nourished.  Looks chronically ill.  Currently sedated. Eyes: PERRL, lids and conjunctivae normal.  Sclera mildly injected. ENMT: Mucous membranes are moist. Posterior pharynx clear of any exudate or lesions. Neck: normal, supple, no masses, no thyromegaly Respiratory: clear to auscultation bilaterally, no wheezing, no crackles. Normal respiratory effort. No accessory muscle use.  Cardiovascular: Tachycardic at 106 bpm, no murmurs / rubs / gallops. No extremity edema. 2+ pedal pulses. No carotid bruits.  Abdomen: No distention. Bowel sounds positive.  Soft, no tenderness, no masses palpated. No hepatosplenomegaly.  Musculoskeletal: no clubbing / cyanosis.  Good ROM, no contractures. Normal muscle tone.  Skin: no  acute rashes, lesions, ulcers on very limited dermatological examination. Neurologic: Looks grossly nonfocal. Psychiatric: Somnolent, sedated.  Unable to fully evaluate.  Labs on Admission: I have personally reviewed following labs and imaging studies  CBC: Recent Labs  Lab 04/10/21 1045  WBC 6.7  NEUTROABS 3.7  HGB 12.4*  HCT 33.3*  MCV 101.5*  PLT 233    Basic Metabolic Panel: Recent Labs  Lab 04/10/21 1045  NA 134*  K 2.8*  CL 92*  CO2 29  GLUCOSE 164*  BUN 15  CREATININE 0.71  CALCIUM 9.8  MG 1.6*  PHOS  1.3*    GFR: Estimated Creatinine Clearance: 99.2 mL/min (by C-G formula based on SCr of 0.71 mg/dL).  Liver Function Tests: Recent Labs  Lab 04/10/21 1045  AST 110*  ALT 36  ALKPHOS 154*  BILITOT 1.7*  PROT 7.3  ALBUMIN 3.7   Radiological Exams on Admission: No results found.  EKG: Independently reviewed.   Assessment/Plan Principal Problem:   Alcohol withdrawal (HCC) Observation/PCU. Folate, MVI, thiamine. Seizure precautions. Lorazepam per CIWA protocol. Consult TOC team. Consult behavioral health in 48 to 72 hours.  Active Problems:   Hypomagnesemia Replaced. Follow-up magnesium level as needed.    Hypokalemia Replacing. Follow-up potassium level.    Hypophosphatemia Replacing. Follow a low phosphorus level as needed.    Hyperglycemia Check fasting glucose in AM. Consider adding hemoglobin A1c if elevated.    DVT prophylaxis:     SCDs. Code Status:   Full code.  Family Communication:  [phone] Valorie Roosevelt (sister). Disposition Plan:   Patient is from:  Home.  Anticipated DC to:  TBD.  Anticipated DC date:  04/12/2021 or 04/13/2021.  Anticipated DC barriers: Clinical status/discharge planning.  Consults called:  TOC team.   Admission status:  Observation/PCU.  Severity of Illness:  High severity after presenting with anorexia and alcohol dependence.  The patient will be admitted for alcohol detoxification first and then will be referred to behavioral health/social work for discharge planning.  Bobette Mo MD [facility]  How to contact the [facility] Attending or Consulting provider 7A - 7P or covering provider during after hours 7P -7A, for this patient?   Check the care team in [facility] and look for a) attending/consulting TRH provider listed and b) the [facility] team listed Log into [email or web address] and use [place]'s universal password to access. If you do not have the password, please contact the hospital operator. Locate the [facility]  provider you are looking for under [facility] and page to a number that you can be directly reached. If you still have difficulty reaching the provider, please page the [facility] (Director on Call) for the Hospitalists listed on amion for assistance.  04/10/2021, 5:26 PM   This document was prepared using Dragon voice recognition software and may contain some unintended transcription errors.

## 2021-04-10 NOTE — Progress Notes (Signed)
Addendum to H&P.  According to the patient's sister, he is already divorced from his wife Miraj Truss and we should not contact her.  Please call his sister Valorie Roosevelt at [phone] for updates or to obtain information.  The nursing staff has asked the front desk to update this on his facesheet.  Sanda Klein, MD.

## 2021-04-11 ENCOUNTER — Other Ambulatory Visit: Payer: Self-pay

## 2021-04-11 DIAGNOSIS — Z20822 Contact with and (suspected) exposure to covid-19: Secondary | ICD-10-CM | POA: Diagnosis present

## 2021-04-11 DIAGNOSIS — R630 Anorexia: Secondary | ICD-10-CM | POA: Diagnosis present

## 2021-04-11 DIAGNOSIS — F10951 Alcohol use, unspecified with alcohol-induced psychotic disorder with hallucinations: Secondary | ICD-10-CM

## 2021-04-11 DIAGNOSIS — F10930 Alcohol use, unspecified with withdrawal, uncomplicated: Secondary | ICD-10-CM | POA: Diagnosis not present

## 2021-04-11 DIAGNOSIS — R739 Hyperglycemia, unspecified: Secondary | ICD-10-CM | POA: Diagnosis present

## 2021-04-11 DIAGNOSIS — F10932 Alcohol use, unspecified with withdrawal with perceptual disturbance: Secondary | ICD-10-CM

## 2021-04-11 DIAGNOSIS — E876 Hypokalemia: Secondary | ICD-10-CM

## 2021-04-11 DIAGNOSIS — F1020 Alcohol dependence, uncomplicated: Secondary | ICD-10-CM | POA: Diagnosis not present

## 2021-04-11 DIAGNOSIS — R45851 Suicidal ideations: Secondary | ICD-10-CM | POA: Diagnosis present

## 2021-04-11 DIAGNOSIS — R5381 Other malaise: Secondary | ICD-10-CM | POA: Diagnosis present

## 2021-04-11 DIAGNOSIS — F1010 Alcohol abuse, uncomplicated: Secondary | ICD-10-CM | POA: Diagnosis not present

## 2021-04-11 DIAGNOSIS — Z87891 Personal history of nicotine dependence: Secondary | ICD-10-CM | POA: Diagnosis not present

## 2021-04-11 DIAGNOSIS — F10939 Alcohol use, unspecified with withdrawal, unspecified: Secondary | ICD-10-CM | POA: Diagnosis present

## 2021-04-11 DIAGNOSIS — G629 Polyneuropathy, unspecified: Secondary | ICD-10-CM | POA: Diagnosis present

## 2021-04-11 DIAGNOSIS — F909 Attention-deficit hyperactivity disorder, unspecified type: Secondary | ICD-10-CM | POA: Diagnosis present

## 2021-04-11 DIAGNOSIS — F10239 Alcohol dependence with withdrawal, unspecified: Secondary | ICD-10-CM | POA: Diagnosis present

## 2021-04-11 DIAGNOSIS — F10959 Alcohol use, unspecified with alcohol-induced psychotic disorder, unspecified: Secondary | ICD-10-CM

## 2021-04-11 DIAGNOSIS — F10251 Alcohol dependence with alcohol-induced psychotic disorder with hallucinations: Secondary | ICD-10-CM | POA: Diagnosis present

## 2021-04-11 DIAGNOSIS — F22 Delusional disorders: Secondary | ICD-10-CM

## 2021-04-11 DIAGNOSIS — R45850 Homicidal ideations: Secondary | ICD-10-CM | POA: Diagnosis present

## 2021-04-11 LAB — COMPREHENSIVE METABOLIC PANEL
ALT: 30 U/L (ref 0–44)
AST: 70 U/L — ABNORMAL HIGH (ref 15–41)
Albumin: 3.1 g/dL — ABNORMAL LOW (ref 3.5–5.0)
Alkaline Phosphatase: 114 U/L (ref 38–126)
Anion gap: 4 — ABNORMAL LOW (ref 5–15)
BUN: 10 mg/dL (ref 6–20)
CO2: 26 mmol/L (ref 22–32)
Calcium: 8.7 mg/dL — ABNORMAL LOW (ref 8.9–10.3)
Chloride: 102 mmol/L (ref 98–111)
Creatinine, Ser: 0.51 mg/dL — ABNORMAL LOW (ref 0.61–1.24)
GFR, Estimated: >60 mL/min (ref >60)
Glucose, Bld: 138 mg/dL — ABNORMAL HIGH (ref 70–99)
Potassium: 3.4 mmol/L — ABNORMAL LOW (ref 3.5–5.1)
Sodium: 132 mmol/L — ABNORMAL LOW (ref 135–145)
Total Bilirubin: 1 mg/dL (ref 0.3–1.2)
Total Protein: 5.6 g/dL — ABNORMAL LOW (ref 6.5–8.1)

## 2021-04-11 LAB — FOLATE: Folate: 18.7 ng/mL (ref >5.9)

## 2021-04-11 LAB — CBC
HCT: 26.3 % — ABNORMAL LOW (ref 39.0–52.0)
Hemoglobin: 9.5 g/dL — ABNORMAL LOW (ref 13.0–17.0)
MCH: 37.4 pg — ABNORMAL HIGH (ref 26.0–34.0)
MCHC: 36.1 g/dL — ABNORMAL HIGH (ref 30.0–36.0)
MCV: 103.5 fL — ABNORMAL HIGH (ref 80.0–100.0)
Platelets: 224 10*3/uL (ref 150–400)
RBC: 2.54 MIL/uL — ABNORMAL LOW (ref 4.22–5.81)
RDW: 12.4 % (ref 11.5–15.5)
WBC: 6.7 10*3/uL (ref 4.0–10.5)
nRBC: 0 % (ref 0.0–0.2)

## 2021-04-11 LAB — TSH: TSH: 3.441 u[IU]/mL (ref 0.350–4.500)

## 2021-04-11 LAB — VITAMIN B12: Vitamin B-12: 725 pg/mL (ref 180–914)

## 2021-04-11 LAB — HIV ANTIBODY (ROUTINE TESTING W REFLEX): HIV Screen 4th Generation wRfx: NONREACTIVE

## 2021-04-11 MED ORDER — PANTOPRAZOLE SODIUM 40 MG PO TBEC
40.0000 mg | DELAYED_RELEASE_TABLET | Freq: Every day | ORAL | Status: DC
  Administered 2021-04-11 – 2021-04-15 (×5): 40 mg via ORAL
  Filled 2021-04-11 (×5): qty 1

## 2021-04-11 MED ORDER — OLANZAPINE 5 MG PO TABS
5.0000 mg | ORAL_TABLET | Freq: Every day | ORAL | Status: DC
  Administered 2021-04-11 – 2021-04-14 (×4): 5 mg via ORAL
  Filled 2021-04-11 (×4): qty 1

## 2021-04-11 MED ORDER — THIAMINE HCL 100 MG PO TABS: 100.0000 mg | ORAL_TABLET | Freq: Every day | ORAL | Status: DC

## 2021-04-11 MED ORDER — POTASSIUM CHLORIDE 20 MEQ PO PACK
40.0000 meq | PACK | Freq: Two times a day (BID) | ORAL | Status: AC
  Administered 2021-04-11 (×2): 40 meq via ORAL
  Filled 2021-04-11 (×2): qty 2

## 2021-04-11 MED ORDER — MAGNESIUM SULFATE 2 GM/50ML IV SOLN
2.0000 g | Freq: Once | INTRAVENOUS | Status: AC
  Administered 2021-04-11: 2 g via INTRAVENOUS
  Filled 2021-04-11: qty 50

## 2021-04-11 MED ORDER — IBUPROFEN 200 MG PO TABS
600.0000 mg | ORAL_TABLET | Freq: Four times a day (QID) | ORAL | Status: DC | PRN
  Administered 2021-04-11 – 2021-04-12 (×2): 600 mg via ORAL
  Filled 2021-04-11 (×2): qty 3

## 2021-04-11 MED ORDER — THIAMINE HCL 100 MG/ML IJ SOLN
200.0000 mg | Freq: Three times a day (TID) | INTRAVENOUS | Status: DC
  Administered 2021-04-11 – 2021-04-15 (×12): 200 mg via INTRAVENOUS
  Filled 2021-04-11 (×13): qty 2

## 2021-04-11 MED ORDER — MAGNESIUM OXIDE -MG SUPPLEMENT 400 (240 MG) MG PO TABS
400.0000 mg | ORAL_TABLET | Freq: Two times a day (BID) | ORAL | Status: DC
  Administered 2021-04-11 – 2021-04-15 (×9): 400 mg via ORAL
  Filled 2021-04-11 (×9): qty 1

## 2021-04-11 NOTE — Consult Note (Signed)
Brief Psychiatry Consult Note  Psychiatry service consulted in afternoon today. Have put in preliminary orders. In brief, pt admitted with EtOH abuse and family concerns over significant withdrawal, paranoia, pt not eating. We have put in preliminary orders (upgrading thiamine to high dose IV given high risk, elevated MCV, cognitive changes, etc). We have requested labwork and further workup documented below.   - increased thiamine to 200 mg TID x5d, oral thereafter  - poor PO intake, EtOH, MCV - please order B12/folate, TSH - please order EKG (last qtc >500) - psychiatry to see tomorrow.  Nicolis Boody A Isham Smitherman

## 2021-04-11 NOTE — Plan of Care (Signed)

## 2021-04-11 NOTE — Progress Notes (Addendum)
During medication administration, nurse informed patient that he would be taking Zyprexa tonight per the recommendations of psychiatry. Patient accused staff of trying to "experiment" on him. Nurse informed patient that purpose of taking administration per the notes from psych.

## 2021-04-11 NOTE — Progress Notes (Signed)
2 Nurse skin assessment completed with Alexcia, RN. Patient has scabs and abrasions on bilateral lower extremities. Patient has scabs on right hip, lower back, right elbow and left elbow. Patient also has scattered bruised on bilateral upper and lower extremities.
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
## 2021-04-12 LAB — COMPREHENSIVE METABOLIC PANEL
ALT: 27 U/L (ref 0–44)
AST: 51 U/L — ABNORMAL HIGH (ref 15–41)
Albumin: 2.9 g/dL — ABNORMAL LOW (ref 3.5–5.0)
Alkaline Phosphatase: 100 U/L (ref 38–126)
Anion gap: 5 (ref 5–15)
BUN: 7 mg/dL (ref 6–20)
CO2: 25 mmol/L (ref 22–32)
Calcium: 8.7 mg/dL — ABNORMAL LOW (ref 8.9–10.3)
Chloride: 105 mmol/L (ref 98–111)
Creatinine, Ser: 0.49 mg/dL — ABNORMAL LOW (ref 0.61–1.24)
GFR, Estimated: >60 mL/min (ref >60)
Glucose, Bld: 106 mg/dL — ABNORMAL HIGH (ref 70–99)
Potassium: 4 mmol/L (ref 3.5–5.1)
Sodium: 135 mmol/L (ref 135–145)
Total Bilirubin: 0.6 mg/dL (ref 0.3–1.2)
Total Protein: 5.4 g/dL — ABNORMAL LOW (ref 6.5–8.1)

## 2021-04-12 LAB — CBC
HCT: 25.2 % — ABNORMAL LOW (ref 39.0–52.0)
Hemoglobin: 8.9 g/dL — ABNORMAL LOW (ref 13.0–17.0)
MCH: 37.6 pg — ABNORMAL HIGH (ref 26.0–34.0)
MCHC: 35.3 g/dL (ref 30.0–36.0)
MCV: 106.3 fL — ABNORMAL HIGH (ref 80.0–100.0)
Platelets: 327 10*3/uL (ref 150–400)
RBC: 2.37 MIL/uL — ABNORMAL LOW (ref 4.22–5.81)
RDW: 12.9 % (ref 11.5–15.5)
WBC: 7.9 10*3/uL (ref 4.0–10.5)
nRBC: 0 % (ref 0.0–0.2)

## 2021-04-12 LAB — LIPID PANEL
Cholesterol: 126 mg/dL (ref 0–200)
HDL: 43 mg/dL (ref >40)
LDL Cholesterol: 73 mg/dL (ref 0–99)
Total CHOL/HDL Ratio: 2.9 RATIO
Triglycerides: 51 mg/dL (ref <150)
VLDL: 10 mg/dL (ref 0–40)

## 2021-04-12 LAB — PHOSPHORUS: Phosphorus: 1.6 mg/dL — ABNORMAL LOW (ref 2.5–4.6)

## 2021-04-12 LAB — HEMOGLOBIN A1C
Hgb A1c MFr Bld: 4.9 % (ref 4.8–5.6)
Mean Plasma Glucose: 93.93 mg/dL

## 2021-04-12 LAB — MAGNESIUM: Magnesium: 1.9 mg/dL (ref 1.7–2.4)

## 2021-04-12 MED ORDER — GABAPENTIN 100 MG PO CAPS
100.0000 mg | ORAL_CAPSULE | Freq: Three times a day (TID) | ORAL | Status: DC
  Administered 2021-04-12 – 2021-04-15 (×9): 100 mg via ORAL
  Filled 2021-04-12 (×9): qty 1

## 2021-04-12 MED ORDER — DOCUSATE SODIUM 100 MG PO CAPS
100.0000 mg | ORAL_CAPSULE | Freq: Two times a day (BID) | ORAL | Status: DC
  Administered 2021-04-12 – 2021-04-14 (×2): 100 mg via ORAL
  Filled 2021-04-12 (×5): qty 1

## 2021-04-12 MED ORDER — K PHOS MONO-SOD PHOS DI & MONO 155-852-130 MG PO TABS
500.0000 mg | ORAL_TABLET | Freq: Three times a day (TID) | ORAL | Status: DC
  Administered 2021-04-12 (×3): 500 mg via ORAL
  Filled 2021-04-12 (×5): qty 2

## 2021-04-12 MED ORDER — POLYETHYLENE GLYCOL 3350 17 G PO PACK
17.0000 g | PACK | Freq: Every day | ORAL | Status: DC
  Administered 2021-04-14: 17 g via ORAL
  Filled 2021-04-12 (×3): qty 1

## 2021-04-12 NOTE — TOC Progression Note (Addendum)
Transition of Care [facility]) - Progression Note    Patient Details  Name: PRAVIN PEREZPEREZ MRN: [id number] Date of Birth: 1966-03-23  Transition of Care [facility]) CM/SW Contact  Darleene Cleaver, [place] Phone Number: 04/12/2021, 3:40 PM  Clinical Narrative:     CSW spoke to patient's sister Toni Amend [phone] and she was wondering if it was possible to get patient to substance abuse treatment.  CSW informed her that the hospital does not place people in substance abuse treatment centers anymore directly from hospital.  CSW informed her that his Child psychotherapist at the [facility] [place] can assist with trying to find out resources available through the [place] for him.  CSW provided patient's sister with contact information for PCP and social worker in [place].  CSW also provided her with a list of substance abuse resources in the area.  CSW informed her that patient has to be agreeable to going, and he can not be forced to go to a facility.  CSW told sister that home health services can be arranged for him as well.  CSW was able to give [facility] the referral for [facility] PT, OT, RN, and Soc Work.  CSW faxed orders and clinicals to patients PCP which is Dr. Randel Books at [phone].  CSW continuing to follow patient's progress throughout discharge planning.        Expected Discharge Plan and Services  Home with home health                                               Social Determinants of Health (SDOH) Interventions    Readmission Risk Interventions No flowsheet data found.

## 2021-04-12 NOTE — Progress Notes (Signed)
PROGRESS NOTE  Mark [name] UJW:[id number] DOB: 23-Oct-1965 DOA: 04/10/2021 PCP: [facility]   LOS: 1 day   Brief narrative: Mark [name] is a 55 y.o. male with with past medical history of ADHD, alcohol abuse presented to hospital brought in to the hospital after his sister and mother went to his residence to do a welfare check.  Sister stated that patient was very paranoid recently and his house was in total disarray and was unable to take care of himself.  In the ED, his vital signs were stable but received 1 mg of Ativan IV fluids.  His initial labs showed normal white count phosphorus was at 1.3 and magnesium 1.6.  Potassium was low at 2.8.  Patient was then admitted to the hospital for further evaluation and treatment.  Assessment/Plan:  Principal Problem:   Alcohol withdrawal (HCC) Active Problems:   Hypomagnesemia   Hypokalemia   Hypophosphatemia   Hyperglycemia   ETOH abuse   Alcohol-induced psychosis (HCC)  Alcohol withdrawal Patient with history of alcohol abuse in the past. Heavy drinker for 25 years.  Continue folic acid, multivitamin, thiamine.  On CIWA protocol.  TOC has been consulted for assistance on discharge.  Multiple acute stress/paranoia.  significant Financial stress, family stress.  Psychiatry on board and patient has been initiated on olanzapine.  Psychiatry following.  Poor oral intake, significant weight loss  Due to ETOH abuse, stressors.  Patient was encouraged on oral intake.  Prolonged QTC.  Continue to replenish electrolytes.  Repeat EKG showed improved QTC intervals.   Hypomagnesemia Latest magnesium of 1.9.   Hypokalemia Replenished.  Check levels in a.m.     Hypophosphatemia Phosphorus level 1.6.  Continue potassium phosphate.  Check levels again.     Hyperglycemia Likely reactive.  Hemoglobin A1c of 4.9.  Deconditioning, debility, difficulty ambulation.  Will get PT evaluation.  Patient will likely need home health OT PT  RN and social work on discharge.  Peripheral neuropathy.  Back pain.  We will add low-dose gabapentin.   DVT prophylaxis: SCDs Start: 04/10/21 1304  Code Status: Full code  Family Communication:  None today.  Spoke with the patient's sister on the phone in detail yesterday.    Status is: Inpatient  The patient is inpatient because: IV treatments appropriate due to intensity of illness or inability to take PO and Inpatient level of care appropriate due to severity of illness, high risk of alcohol withdrawal symptoms, unsafe disposition.  Dispo: The patient is from: Home              Anticipated d/c is to: Home              Patient currently is not medically stable to d/c.   Difficult to place patient No   Consultants: Psychiatry.  Procedures: none  Anti-infectives:  None  Anti-infectives (From admission, onward)    None      Subjective: Today, patient was seen and examined at bedside.  States that he could not sleep well.  Complains of pain over the hips and legs.  Objective: Vitals:   04/12/21 0927 04/12/21 1237  BP: 108/67 92/64  Pulse: (!) 107 (!) 102  Resp: 12 18  Temp: 98.9 F (37.2 C) 98.3 F (36.8 C)  SpO2: 100% 100%    Intake/Output Summary (Last 24 hours) at 04/12/2021 1306 Last data filed at 04/12/2021 0923 Gross per 24 hour  Intake 1636.19 ml  Output 2900 ml  Net -1263.81 ml  Filed Weights   04/10/21 1015  Weight: 68 kg   Body mass index is 18.26 kg/m.   Physical Exam:  GENERAL: Patient is alert awake and oriented. Not in obvious distress. Thinly built HENT: No scleral pallor or icterus. Pupils equally reactive to light. Oral mucosa is moist NECK: is supple, no gross swelling noted. CHEST: Clear to auscultation. No crackles or wheezes.  Diminished breath sounds bilaterally. CVS: S1 and S2 heard, no murmur. Regular rate and rhythm.  ABDOMEN: Soft, non-tender, bowel sounds are present. EXTREMITIES: No edema. CNS: Cranial nerves are  intact. Moves all the extremities.  Mild tremors noted.  Flat affect. SKIN: warm and dry without rashes.  Data Review: I have personally reviewed the following laboratory data and studies,  CBC: Recent Labs  Lab 04/10/21 1045 04/11/21 0347 04/12/21 0406  WBC 6.7 6.7 7.9  NEUTROABS 3.7  --   --   HGB 12.4* 9.5* 8.9*  HCT 33.3* 26.3* 25.2*  MCV 101.5* 103.5* 106.3*  PLT 233 224 327    Basic Metabolic Panel: Recent Labs  Lab 04/10/21 1045 04/11/21 0347 04/12/21 0406  NA 134* 132* 135  K 2.8* 3.4* 4.0  CL 92* 102 105  CO2 29 26 25   GLUCOSE 164* 138* 106*  BUN 15 10 7   CREATININE 0.71 0.51* 0.49*  CALCIUM 9.8 8.7* 8.7*  MG 1.6*  --  1.9  PHOS 1.3*  --  1.6*    Liver Function Tests: Recent Labs  Lab 04/10/21 1045 04/11/21 0347 04/12/21 0406  AST 110* 70* 51*  ALT 36 30 27  ALKPHOS 154* 114 100  BILITOT 1.7* 1.0 0.6  PROT 7.3 5.6* 5.4*  ALBUMIN 3.7 3.1* 2.9*    Recent Labs  Lab 04/10/21 1045  LIPASE 35    No results for input(s): AMMONIA in the last 168 hours. Cardiac Enzymes: No results for input(s): CKTOTAL, CKMB, CKMBINDEX, TROPONINI in the last 168 hours. BNP (last 3 results) No results for input(s): BNP in the last 8760 hours.  ProBNP (last 3 results) No results for input(s): PROBNP in the last 8760 hours.  CBG: No results for input(s): GLUCAP in the last 168 hours. No results found for this or any previous visit (from the past 240 hour(s)).   Studies: No results found.   06/12/21, MD  [facility] 04/12/2021  If 7PM-7AM, please contact night-coverage

## 2021-04-12 NOTE — Discharge Instructions (Signed)

## 2021-04-12 NOTE — Consult Note (Signed)
[facility] Psychiatry New Psychiatric Evaluation   Service Date: April 12, 2021 LOS:  LOS: 1 day    Assessment  Mark [name] is a 55 y.o. male admitted medically for 04/10/2021  9:57 AM for alcohol withdrawal. He carries the psychiatric diagnoses of alcohol use disorder and ADHD and has an unknown past medical history.Psychiatry was consulted for reports of paranoia, living in squalor, and concern by family by [facility] Pok[facility]rel.    His current presentation of paranoia, guardedness, thought disorganization, and episodic hallucinations is most consistent with psychosis. 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 He has no current (or reported historical) outpt psychotropic medications but is amenable to trial of olanzapine. On initial examination, patient was guarded and often gave incorrect answers to questions (in the sense that he was trying to say things he thought this author wanted to hear); mother at bedside helped to provide history. He is sensitive to how people react to him and often uses this to cover for deficits. He is amenable to trial of olanzapine for psychosis, sleep - this likely does not need to be long term med (if successful would use x6 months) if he is able to attain sobriety.   Cadence of speech is off - uses pronouns strangely - thought process is at times quite difficult to follow. He is tuned into facial and body expressions and when he realizes other people are not tracking with him (as with bizarre story about urinalysis) he quickly changes the topic and deflects. It does not happen often enough that I am worried about confabulation, word salad, etc (1-2x during interview) but contributes to overall impression of thought disorganization and psychosis.   10/7: pt seen - slept well. Overall thoughts appeared more organized. In precontemplation for psych hospital stay,  rehab. Willing to continue taking medications. Recommended PT referral to help determine dispo.    Please see plan below for detailed recommendations.   Diagnoses:  Active Hospital problems: Principal Problem:   Alcohol withdrawal (HCC) Active Problems:   Hypomagnesemia   Hypokalemia   Hypophosphatemia   Hyperglycemia   ETOH abuse   Alcohol-induced psychosis (HCC)    Problems edited/added by me: No problems updated.  Plan  ## Safety and Observation Level:  - Based on my clinical evaluation, I estimate the patient to be at low risk of self harm in the current setting - At this time, we recommend a routine level of observation. This decision is based on my review of the chart including patient's history and current presentation, interview of the patient, mental status examination, and consideration of suicide risk including evaluating suicidal ideation, plan, intent, suicidal or self-harm behaviors, risk factors, and protective factors. This judgment is based on our ability to directly address suicide risk, implement suicide prevention strategies and develop a safety plan while the patient is in the clinical setting. Please contact our team if there is a concern that risk level has changed.   ## Medications:  -- olanzapine 5 mg  -- qtc 476   ## Medical Decision Making Capacity:  Not formally assessed  ## Further Work-up:  -- a1c, lipid panel, tsh, B12/folate all wnl  ## Disposition:  -- unclear at this time  Thank you for this consult request. Recommendations have been communicated to the primary team.  We will continue to follow at this time.   Daphney Hopke A Alnisa Hasley    NEW history  Relevant Aspects of Hospital Course:  Admitted on 04/10/2021 for EtOH withdrawal.  Patient Report:  Pt seen in afternoon. Overall he is doing better physically, although hasn't gotten out of bed yet. He had no side effect form olanzapine other than a good night of sleep. No SI, HI, AH/VH.  Discussed options for further treatment - not interested in rehab, psych hospital stay, or AA at this time. States mood is 6/10 and was 8/10 prior to admission (although pt was found near comatose by family).   Had extensive discussion with sister about potential dispo options.   ROS:  Denied all elements of full ROS    Psychiatric History:  Information collected from pt, mother  Never talked to a psychiatrist No psych meds Never hospitalized  Family psych history: pt and mother deny  Medical History: Past Medical History:  Diagnosis Date  . ADHD (attention deficit hyperactivity disorder)     Surgical History: Past Surgical History:  Procedure Laterality Date  . NO PAST SURGERIES      Medications:   Current Facility-Administered Medications:  .  acetaminophen (TYLENOL) tablet 650 mg, 650 mg, Oral, Q6H PRN, 650 mg at 04/11/21 1410 **OR** acetaminophen (TYLENOL) suppository 650 mg, 650 mg, Rectal, Q6H PRN, Bobette Mo, MD .  docusate sodium (COLACE) capsule 100 mg, 100 mg, Oral, BID, Pokhrel, Laxman, MD .  folic acid (FOLVITE) tablet 1 mg, 1 mg, Oral, Daily, Bobette Mo, MD, 1 mg at 04/12/21 0917 .  gabapentin (NEURONTIN) capsule 100 mg, 100 mg, Oral, TID, Pokhrel, Laxman, MD, 100 mg at 04/12/21 1529 .  ibuprofen (ADVIL) tablet 600 mg, 600 mg, Oral, Q6H PRN, Pokhrel, Laxman, MD, 600 mg at 04/12/21 0937 .  [EXPIRED] LORazepam (ATIVAN) injection 0-4 mg, 0-4 mg, Intravenous, Q4H, 1 mg at 04/12/21 0939 **FOLLOWED BY** LORazepam (ATIVAN) injection 0-4 mg, 0-4 mg, Intravenous, Q8H, Bobette Mo, MD .  LORazepam (ATIVAN) tablet 1-4 mg, 1-4 mg, Oral, Q1H PRN **OR** LORazepam (ATIVAN) injection 1-4 mg, 1-4 mg, Intravenous, Q1H PRN, Bobette Mo, MD .  magnesium oxide (MAG-OX) tablet 400 mg, 400 mg, Oral, BID, Pokhrel, Laxman, MD, 400 mg at 04/12/21 0917 .  multivitamin with minerals tablet 1 tablet, 1 tablet, Oral, Daily, Bobette Mo, MD, 1 tablet  at 04/12/21 [id number] .  OLANZapine (ZYPREXA) tablet 5 mg, 5 mg, Oral, QHS, Anyah Swallow A, 5 mg at 04/11/21 2112 .  ondansetron (ZOFRAN) tablet 4 mg, 4 mg, Oral, Q6H PRN **OR** ondansetron (ZOFRAN) injection 4 mg, 4 mg, Intravenous, Q6H PRN, Bobette Mo, MD .  pantoprazole (PROTONIX) EC tablet 40 mg, 40 mg, Oral, Daily, Pokhrel, Laxman, MD, 40 mg at 04/12/21 0917 .  phosphorus (K PHOS NEUTRAL) tablet 500 mg, 500 mg, Oral, TID, Pokhrel, Laxman, MD, 500 mg at 04/12/21 1327 .  polyethylene glycol (MIRALAX / GLYCOLAX) packet 17 g, 17 g, Oral, Daily, Pokhrel, Laxman, MD .  thiamine (B-1) 200 mg in sodium chloride 0.9 % 50 mL IVPB, 200 mg, Intravenous, TID, Last Rate: 100 mL/hr at 04/12/21 1535, 200 mg at 04/12/21 1535 **FOLLOWED BY** [START ON 04/17/2021] thiamine tablet 100 mg, 100 mg, Oral, Daily, Westlee Devita A  Allergies: No Known Allergies  Social History:  Lives alone  Tobacco use: denied (mom stated he douse use) Alcohol use: see HPI Drug use: see HPI  Family History: denied family psych hx, mom concurred The patient's family history includes Allergic rhinitis in his mother and sister.    Objective  Vital signs:  Temp:  [97.5 F (36.4 C)-99.3 F (37.4 C)] 98.4 F (36.9 C) (10/07 1538) Pulse Rate:  [92-107] 92 (10/07 1538) Resp:  [12-18] 18 (10/07 1538) BP: (90-108)/(62-72) 98/67 (10/07 1538) SpO2:  [98 %-100 %] 100 % (10/07 1538)  Physical Exam: Gen: Lying in bed, no acute distress Pulm: no increased WOB Neuro: CNII-XII grossly intact Psych: alert, oriented, less difficulty with attention  Mental Status Exam: Appearance: Lying in bed, thin, ill-appearing  Attitude:  Superficially cooperative, guarded  Behavior/Psychomotor: Slightly decreased rate of movement, gesturing, speech  Speech/Language:  Flattened prosody, adequate amount, no latency   Mood: 6/10  Affect: aloof  Thought process: Loosely connected, derails frequently   Thought content:    Devoid of SI/HI, paranoia not evident on exam today  Perceptual disturbances:  None at time of interview, episodic AH in last few months  Attention: Moderately improved  Concentration: Moderately improved  Orientation: Self, situation, month/year   Memory: Recent poor, remote intact  Fund of knowledge:  good  Insight:   poor  Judgment:  poor  Impulse Control: poor

## 2021-04-12 NOTE — Evaluation (Signed)
Physical Therapy Evaluation Patient Details Name: Mark [name] MRN: [id number] DOB: 15-Oct-1965 Today's Date: 04/12/2021  History of Present Illness  Mark [name] is a 55 y.o. male presents after his sister and mother went to his residence to do a welfare check.  Sister stated that patient was very paranoid recently and his house was in total disarray and was unable to take care of himself. PMH: ADHD, alcohol abuse   Clinical Impression  Pt admitted with above diagnosis. Pt reports being independent at baseline, lives in 3 story condo, bedroom on 3rd floor, reports ~4 falls due to not eating and becoming weak. Pt has mom and sister who check on him periodically. Pt requiring min A with limited ambulation in room using RW, maintains weight posterior, generally unsteady without LOB. HR up to 140 max noted with ambulation, pt denies chest pain, dizziness, racing sensation; HR returns to 97 once supine. Pt does reports 8/10 bil hip pain that could be limiting ambulation, but doesn't specify what is limiting. Pt currently with functional limitations due to the deficits listed below (see PT Problem List). Pt will benefit from skilled PT to increase their independence and safety with mobility to allow discharge to the venue listed below.          Recommendations for follow up therapy are one component of a multi-disciplinary discharge planning process, led by the attending physician.  Recommendations may be updated based on patient status, additional functional criteria and insurance authorization.  Follow Up Recommendations Supervision/Assistance - 24 hour;Home health PT     Equipment Recommendations  Rolling walker with 5" wheels    Recommendations for Other Services       Precautions / Restrictions Precautions Precautions: Fall Restrictions Weight Bearing Restrictions: No      Mobility  Bed Mobility Overal bed mobility: Modified Independent     Transfers Overall transfer  level: Needs assistance Equipment used: Rolling walker (2 wheeled) Transfers: Sit to/from Stand Sit to Stand: Min guard  General transfer comment: powers to stand with BUE assist  Ambulation/Gait Ambulation/Gait assistance: Min assist Gait Distance (Feet): 15 Feet Assistive device: Rolling walker (2 wheeled) Gait Pattern/deviations: Step-through pattern;Decreased stride length;Narrow base of support Gait velocity: decreased   General Gait Details: pt ambulates in room with weight posterior with toes elevated from floor, VCs for positioning within RW frame, generally unsteady without LOB  Stairs            Wheelchair Mobility    Modified Rankin (Stroke Patients Only)       Balance Overall balance assessment: Needs assistance;History of Falls Sitting-balance support: Feet supported Sitting balance-Leahy Scale: Good Sitting balance - Comments: seated EOB   Standing balance support: During functional activity;Bilateral upper extremity supported Standing balance-Leahy Scale: Poor Standing balance comment: reliant on UE support, min A       Pertinent Vitals/Pain Pain Assessment: 0-10 Pain Score: 8  Pain Location: head and hips Pain Descriptors / Indicators: Sore;Aching;Sharp Pain Intervention(s): Limited activity within patient's tolerance;Monitored during session;Patient requesting pain meds-RN notified    Home Living Family/patient expects to be discharged to:: Private residence Living Arrangements: Alone Available Help at Discharge: Family;Available PRN/intermittently Type of Home: Other(Comment) (3 story condo) Home Access: Level entry     Home Layout: Multi-level (3 story condo) Home Equipment: None      Prior Function Level of Independence: Independent  Comments: Pt reports independent with ADLs/IADLs, community ambulation, drives. Pt reports ~4 falls due to not eating.     Hand  Dominance        Extremity/Trunk Assessment   Upper Extremity  Assessment Upper Extremity Assessment: Overall WFL for tasks assessed    Lower Extremity Assessment Lower Extremity Assessment: Overall WFL for tasks assessed (AROM WNL, strength 4/5 throughout, denies numbness/tingling)    Cervical / Trunk Assessment Cervical / Trunk Assessment: Normal  Communication   Communication: No difficulties  Cognition Arousal/Alertness: Awake/alert Behavior During Therapy: WFL for tasks assessed/performed Overall Cognitive Status: Within Functional Limits for tasks assessed       General Comments General comments (skin integrity, edema, etc.): HR 140 max noted with ambulation, denies dizziness, racing sensation, pain    Exercises     Assessment/Plan    PT Assessment Patient needs continued PT services  PT Problem List Decreased strength;Decreased activity tolerance;Decreased balance;Decreased knowledge of use of DME;Decreased safety awareness;Pain       PT Treatment Interventions DME instruction;Gait training;Stair training;Functional mobility training;Therapeutic activities;Therapeutic exercise;Balance training;Patient/family education    PT Goals (Current goals can be found in the Care Plan section)  Acute Rehab PT Goals Patient Stated Goal: home PT Goal Formulation: With patient Time For Goal Achievement: 04/26/21 Potential to Achieve Goals: Good    Frequency Min 3X/week   Barriers to discharge        Co-evaluation               AM-PAC PT "6 Clicks" Mobility  Outcome Measure Help needed turning from your back to your side while in a flat bed without using bedrails?: None Help needed moving from lying on your back to sitting on the side of a flat bed without using bedrails?: None Help needed moving to and from a bed to a chair (including a wheelchair)?: A Little Help needed standing up from a chair using your arms (e.g., wheelchair or bedside chair)?: A Little Help needed to walk in hospital room?: A Little Help needed climbing  3-5 steps with a railing? : A Lot 6 Click Score: 19    End of Session Equipment Utilized During Treatment: Gait belt Activity Tolerance: Patient tolerated treatment well Patient left: in bed;with call bell/phone within reach;with bed alarm set Nurse Communication: Mobility status PT Visit Diagnosis: Unsteadiness on feet (R26.81);Other abnormalities of gait and mobility (R26.89);Repeated falls (R29.6)    Time: 9688-6484 PT Time Calculation (min) (ACUTE ONLY): 17 min   Charges:   PT Evaluation $PT Eval Low Complexity: 1 Low           Tori Renesha Lizama PT, DPT 04/12/21, 3:29 PM

## 2021-04-13 DIAGNOSIS — F10932 Alcohol use, unspecified with withdrawal with perceptual disturbance: Secondary | ICD-10-CM

## 2021-04-13 DIAGNOSIS — F10951 Alcohol use, unspecified with alcohol-induced psychotic disorder with hallucinations: Secondary | ICD-10-CM

## 2021-04-13 LAB — BASIC METABOLIC PANEL
Anion gap: 9 (ref 5–15)
BUN: 10 mg/dL (ref 6–20)
CO2: 26 mmol/L (ref 22–32)
Calcium: 9 mg/dL (ref 8.9–10.3)
Chloride: 108 mmol/L (ref 98–111)
Creatinine, Ser: 0.54 mg/dL — ABNORMAL LOW (ref 0.61–1.24)
GFR, Estimated: >60 mL/min (ref >60)
Glucose, Bld: 136 mg/dL — ABNORMAL HIGH (ref 70–99)
Potassium: 3.4 mmol/L — ABNORMAL LOW (ref 3.5–5.1)
Sodium: 143 mmol/L (ref 135–145)

## 2021-04-13 LAB — CBC
HCT: 24 % — ABNORMAL LOW (ref 39.0–52.0)
Hemoglobin: 8.4 g/dL — ABNORMAL LOW (ref 13.0–17.0)
MCH: 37 pg — ABNORMAL HIGH (ref 26.0–34.0)
MCHC: 35 g/dL (ref 30.0–36.0)
MCV: 105.7 fL — ABNORMAL HIGH (ref 80.0–100.0)
Platelets: 383 10*3/uL (ref 150–400)
RBC: 2.27 MIL/uL — ABNORMAL LOW (ref 4.22–5.81)
RDW: 12.9 % (ref 11.5–15.5)
WBC: 8.2 10*3/uL (ref 4.0–10.5)
nRBC: 0 % (ref 0.0–0.2)

## 2021-04-13 LAB — PHOSPHORUS: Phosphorus: 5.2 mg/dL — ABNORMAL HIGH (ref 2.5–4.6)

## 2021-04-13 LAB — MAGNESIUM: Magnesium: 1.6 mg/dL — ABNORMAL LOW (ref 1.7–2.4)

## 2021-04-13 MED ORDER — POTASSIUM CHLORIDE CRYS ER 20 MEQ PO TBCR
40.0000 meq | EXTENDED_RELEASE_TABLET | Freq: Two times a day (BID) | ORAL | Status: AC
  Administered 2021-04-13 – 2021-04-14 (×4): 40 meq via ORAL
  Filled 2021-04-13 (×4): qty 2

## 2021-04-13 MED ORDER — MAGNESIUM SULFATE 2 GM/50ML IV SOLN
2.0000 g | Freq: Once | INTRAVENOUS | Status: AC
  Administered 2021-04-13: 2 g via INTRAVENOUS
  Filled 2021-04-13: qty 50

## 2021-04-13 NOTE — Progress Notes (Signed)
Physical Therapy Treatment Patient Details Name: Mark [name] MRN: [id number] DOB: [birth year] Today's Date: 04/13/2021   History of Present Illness Mark [name] is a 55 y.o. male presents after his sister and mother went to his residence to do a welfare check.  Sister stated that patient was very paranoid recently and his house was in total disarray and was unable to take care of himself. PMH: ADHD, alcohol abuse    PT Comments    Pt ambulates 40 ft with RW, slightly unsteady requiring min guard, cues for positioning with RW frame, HR up to 143 without symptoms. Pt returns back to supine despite encouragement to sit up in recliner. RN notified of HR with ambulation and no symptoms. Will continue to progress PT as able.    Recommendations for follow up therapy are one component of a multi-disciplinary discharge planning process, led by the attending physician.  Recommendations may be updated based on patient status, additional functional criteria and insurance authorization.  Follow Up Recommendations  Supervision/Assistance - 24 hour;Home health PT     Equipment Recommendations  Rolling [name] with 5" wheels    Recommendations for Other Services       Precautions / Restrictions Precautions Precautions: Fall Precaution Comments: monitor HR Restrictions Weight Bearing Restrictions: No     Mobility  Bed Mobility Overal bed mobility: Modified Independent   Transfers Overall transfer level: Needs assistance Equipment used: Rolling [name] (2 wheeled) Transfers: Sit to/from Stand Sit to Stand: Supervision  General transfer comment: light UE assist, supv for safety  Ambulation/Gait Ambulation/Gait assistance: Min guard Gait Distance (Feet): 40 Feet Assistive device: Rolling [name] (2 wheeled) Gait Pattern/deviations: Step-through pattern;Decreased stride length;Narrow base of support Gait velocity: slightly decreased   General Gait Details: step through pattern  with slightly unsteady gait but without LOB, min guard for steadying, VCs for body positioning within RW frame; limited by HR up to 143 with ambulation though pt completely asymptomatic   Stairs             Wheelchair Mobility    Modified Rankin (Stroke Patients Only)       Balance Overall balance assessment: Needs assistance;History of Falls Sitting-balance support: Feet supported Sitting balance-Leahy Scale: Good Sitting balance - Comments: seated EOB   Standing balance support: During functional activity;Bilateral upper extremity supported Standing balance-Leahy Scale: Poor Standing balance comment: reliant on UE support       Cognition Arousal/Alertness: Awake/alert Behavior During Therapy: WFL for tasks assessed/performed Overall Cognitive Status: Within Functional Limits for tasks assessed       Exercises      General Comments General comments (skin integrity, edema, etc.): HR 143 max noted wtih ambulation, denies dizziness, raciness, pain, no complaints      Pertinent Vitals/Pain Pain Assessment: No/denies pain    Home Living                      Prior Function            PT Goals (current goals can now be found in the care plan section) Acute Rehab PT Goals Patient Stated Goal: home PT Goal Formulation: With patient Time For Goal Achievement: 04/26/21 Potential to Achieve Goals: Good Progress towards PT goals: Progressing toward goals    Frequency    Min 3X/week      PT Plan Current plan remains appropriate    Co-evaluation              AM-PAC PT "6 Clicks"  Mobility   Outcome Measure  Help needed turning from your back to your side while in a flat bed without using bedrails?: None Help needed moving from lying on your back to sitting on the side of a flat bed without using bedrails?: None Help needed moving to and from a bed to a chair (including a wheelchair)?: A Little Help needed standing up from a chair using your  arms (e.g., wheelchair or bedside chair)?: A Little Help needed to walk in hospital room?: A Little Help needed climbing 3-5 steps with a railing? : A Lot 6 Click Score: 19    End of Session Equipment Utilized During Treatment: Gait belt Activity Tolerance: Patient tolerated treatment well;Other (comment) (HR) Patient left: in bed;with call bell/phone within reach;with bed alarm set Nurse Communication: Mobility status;Other (comment) (HR) PT Visit Diagnosis: Unsteadiness on feet (R26.81);Other abnormalities of gait and mobility (R26.89);Repeated falls (R29.6)     Time: 8115-7262 PT Time Calculation (min) (ACUTE ONLY): 12 min  Charges:  $Gait Training: 8-22 mins                      Tori Angelly Spearing PT, DPT 04/13/21, 1:16 PM

## 2021-04-13 NOTE — Progress Notes (Addendum)
PROGRESS NOTE  Mark [name] ENI:[id number] DOB: [birth year] DOA: 04/10/2021 PCP: [facility]   LOS: 2 days   Brief narrative: Mark [name] is a 55 y.o. male with with past medical history of ADHD, alcohol abuse presented to hospital brought in to the hospital after his sister and mother went to his residence to do a welfare check.  Sister stated that patient was very paranoid recently and his house was in total disarray and was unable to take care of himself.  In the ED, his vital signs were stable but received 1 mg of Ativan IV fluids.  His initial labs showed normal white count phosphorus was at 1.3 and magnesium 1.6.  Potassium was low at 2.8.  Patient was then admitted to the hospital for further evaluation and treatment.  Assessment/Plan:  Principal Problem:   Alcohol withdrawal (HCC) Active Problems:   Hypomagnesemia   Hypokalemia   Hypophosphatemia   Hyperglycemia   ETOH abuse   Alcohol-induced psychosis (HCC)  Alcohol withdrawal Patient with history of alcohol abuse in the past. Heavy drinker for 25 years.  Continue folic acid, multivitamin, thiamine.  On CIWA protocol.  TOC has been consulted for assistance on discharge.  Multiple acute stress/paranoia.  significant Financial stress, family stress.  Psychiatry on board and patient has been initiated on olanzapine.  Psychiatry following.  Poor oral intake, significant weight loss  Due to ETOH abuse, stressors.  Patient was encouraged on oral intake.  Prolonged QTC.  Improved.  Continue to replenish potassium and magnesium   Hypomagnesemia Mild we will continue to replenish orally.  Add IV magnesium sulfate 2 g today.   Hypokalemia Will continue to replenish.  Check levels in a.m. continue KCl 40 mg twice daily for next 4 doses.     Hypophosphatemia Improved.  Will discontinue K-Phos     Hyperglycemia Likely reactive.  Hemoglobin A1c of 4.9.  Deconditioning, debility, difficulty ambulation.  Seen by  physical therapy recommended supervision and 24-hour assistance/home health PT.  Patient will likely need home health OT PT RN and social work on discharge.  Peripheral neuropathy.  Back pain.  Continue low-dose gabapentin.   DVT prophylaxis: SCDs Start: 04/10/21 1304  Code Status: Full code  Family Communication:  I spoke with the patient's sister on the phone and updated her about the clinical condition of the patient.   Status is: Inpatient  The patient is inpatient because: IV treatments appropriate due to intensity of illness or inability to take PO and Inpatient level of care appropriate due to severity of illness, high risk of alcohol withdrawal symptoms, unsafe disposition.  Dispo: The patient is from: Home              Anticipated d/c is to: Home              Patient currently is not medically stable to d/c.   Difficult to place patient No  Consultants: Psychiatry.  Procedures: none  Anti-infectives:  None  Anti-infectives (From admission, onward)    None      Subjective: Today, patient was seen and examined at bedside.  He states that he did have some bowel movement.  Denies overt pain today.  No nausea, vomiting, hallucinations or diaphoresis.  Objective: Vitals:   04/13/21 1045 04/13/21 1215  BP: 108/77 125/78  Pulse: 99 100  Resp:  16  Temp:  98.3 F (36.8 C)  SpO2: 100% 100%    Intake/Output Summary (Last 24 hours) at 04/13/2021 1324 Last data  filed at 04/13/2021 0832 Gross per 24 hour  Intake 45.89 ml  Output 1300 ml  Net -1254.11 ml    Filed Weights   04/10/21 1015  Weight: 68 kg   Body mass index is 18.26 kg/m.   Physical Exam: General: Thinly built, not in obvious distress HENT:   No scleral pallor or icterus noted. Oral mucosa is moist.  Chest:  Clear breath sounds.  Diminished breath sounds bilaterally. No crackles or wheezes.  CVS: S1 &S2 heard. No murmur.  Regular rate and rhythm. Abdomen: Soft, nontender, nondistended.  Bowel  sounds are heard.   Extremities: No cyanosis, clubbing or edema.  Peripheral pulses are palpable. Psych: Alert, awake and oriented to place.,  Poor insight.  Flat affect. CNS:  No cranial nerve deficits.  Moving all extremities Skin: Warm and dry.  No rashes noted.  Data Review: I have personally reviewed the following laboratory data and studies,  CBC: Recent Labs  Lab 04/10/21 1045 04/11/21 0347 04/12/21 0406 04/13/21 0409  WBC 6.7 6.7 7.9 8.2  NEUTROABS 3.7  --   --   --   HGB 12.4* 9.5* 8.9* 8.4*  HCT 33.3* 26.3* 25.2* 24.0*  MCV 101.5* 103.5* 106.3* 105.7*  PLT 233 224 327 383    Basic Metabolic Panel: Recent Labs  Lab 04/10/21 1045 04/11/21 0347 04/12/21 0406 04/13/21 0409  NA 134* 132* 135 143  K 2.8* 3.4* 4.0 3.4*  CL 92* 102 105 108  CO2 29 26 25 26   GLUCOSE 164* 138* 106* 136*  BUN 15 10 7 10   CREATININE 0.71 0.51* 0.49* 0.54*  CALCIUM 9.8 8.7* 8.7* 9.0  MG 1.6*  --  1.9 1.6*  PHOS 1.3*  --  1.6* 5.2*    Liver Function Tests: Recent Labs  Lab 04/10/21 1045 04/11/21 0347 04/12/21 0406  AST 110* 70* 51*  ALT 36 30 27  ALKPHOS 154* 114 100  BILITOT 1.7* 1.0 0.6  PROT 7.3 5.6* 5.4*  ALBUMIN 3.7 3.1* 2.9*    Recent Labs  Lab 04/10/21 1045  LIPASE 35    No results for input(s): AMMONIA in the last 168 hours. Cardiac Enzymes: No results for input(s): CKTOTAL, CKMB, CKMBINDEX, TROPONINI in the last 168 hours. BNP (last 3 results) No results for input(s): BNP in the last 8760 hours.  ProBNP (last 3 results) No results for input(s): PROBNP in the last 8760 hours.  CBG: No results for input(s): GLUCAP in the last 168 hours. No results found for this or any previous visit (from the past 240 hour(s)).   Studies: No results found.   06/12/21, MD  [facility] 04/13/2021  If 7PM-7AM, please contact night-coverage

## 2021-04-14 LAB — BASIC METABOLIC PANEL
Anion gap: 8 (ref 5–15)
BUN: 9 mg/dL (ref 6–20)
CO2: 22 mmol/L (ref 22–32)
Calcium: 8.5 mg/dL — ABNORMAL LOW (ref 8.9–10.3)
Chloride: 106 mmol/L (ref 98–111)
Creatinine, Ser: 0.74 mg/dL (ref 0.61–1.24)
GFR, Estimated: >60 mL/min (ref >60)
Glucose, Bld: 92 mg/dL (ref 70–99)
Potassium: 3.8 mmol/L (ref 3.5–5.1)
Sodium: 136 mmol/L (ref 135–145)

## 2021-04-14 LAB — CBC
HCT: 26 % — ABNORMAL LOW (ref 39.0–52.0)
Hemoglobin: 9.1 g/dL — ABNORMAL LOW (ref 13.0–17.0)
MCH: 37.8 pg — ABNORMAL HIGH (ref 26.0–34.0)
MCHC: 35 g/dL (ref 30.0–36.0)
MCV: 107.9 fL — ABNORMAL HIGH (ref 80.0–100.0)
Platelets: 377 10*3/uL (ref 150–400)
RBC: 2.41 MIL/uL — ABNORMAL LOW (ref 4.22–5.81)
RDW: 13 % (ref 11.5–15.5)
WBC: 10.7 10*3/uL — ABNORMAL HIGH (ref 4.0–10.5)
nRBC: 0 % (ref 0.0–0.2)

## 2021-04-14 LAB — MAGNESIUM: Magnesium: 1.7 mg/dL (ref 1.7–2.4)

## 2021-04-14 NOTE — Consult Note (Signed)
[facility] Face-to-Face Psychiatry Consult   Reason for Consult:  Paranio, EToH abuse and not eating  Referring Physician:  Joycelyn Das  Patient Identification: Mark [name] MRN:  [id number] Principal Diagnosis: Alcohol withdrawal (HCC) Diagnosis:  Principal Problem:   Alcohol withdrawal (HCC) Active Problems:   Hypomagnesemia   Hypokalemia   Hypophosphatemia   Hyperglycemia   ETOH abuse   Alcohol-induced psychosis (HCC)   Total Time spent with patient: 15 minutes  Subjective:  Mark [name] is a 55 y.o. male was seen and evaluated face-to-face.  He is awake, alert and oriented x3. calm/cooperative; and mood congruent with affect. He presents with a bright and pleasant affect.  NP observed patient actively eating  breakfast . Patient reported" this is my second tray I am making up for lost  time."    He is speaking in a clear tone at moderate volume, and normal pace; with good eye contact. His thought process is coherent and relevant; There is no indication that he is currently responding to internal/external stimuli or experiencing delusional thought content; and he has denied suicidal/self-harm/homicidal ideation, psychosis, and paranoia.   Jru reports " my family was concerned about me, but I am doing alright " patient is  denying suicidal or homicidal ideations.  Denies paranoia ideations.  Denies auditory or visual hallucinations.  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  States his longest period of sobriety was 2 years.  States that was roughly 5 years ago.  Patient was offered outpatient resources however he declined.  States " I quit cold [place] last time."     Past Psychiatric History:   Risk to Self:   Risk to Others:   Prior Inpatient Therapy:   Prior Outpatient Therapy:    Past Medical History:  Past Medical History:  Diagnosis Date   ADHD (attention deficit hyperactivity disorder)     Past Surgical History:   Procedure Laterality Date   NO PAST SURGERIES     Family History:  Family History  Problem Relation Age of Onset   Allergic rhinitis Mother    Allergic rhinitis Sister    Family Psychiatric  History: Social History:  Social History   Substance and Sexual Activity  Alcohol Use Yes   Alcohol/week: 0.0 standard drinks     Social History   Substance and Sexual Activity  Drug Use Never    Social History   Socioeconomic History   Marital status: Single    Spouse name: Not on file   Number of children: Not on file   Years of education: Not on file   Highest education level: Not on file  Occupational History   Not on file  Tobacco Use   Smoking status: Former    Types: Cigarettes    Quit date: 05/07/2014    Years since quitting: 6.9   Smokeless tobacco: Never  Substance and Sexual Activity   Alcohol use: Yes    Alcohol/week: 0.0 standard drinks   Drug use: Never   Sexual activity: Not on file  Other Topics Concern   Not on file  Social History Narrative   Not on file   Social Determinants of Health   Financial Resource Strain: Not on file  Food Insecurity: Not on file  Transportation Needs: Not on file  Physical Activity: Not on file  Stress: Not on file  Social Connections: Not on file   Additional Social History:    Allergies:  No Known Allergies  Labs:  Results for orders placed or performed during the hospital encounter of 04/10/21 (from the past 48 hour(s))  Basic metabolic panel     Status: Abnormal   Collection Time: 04/13/21  4:09 AM  Result Value Ref Range   Sodium 143 135 - 145 mmol/L    Comment: DELTA CHECK NOTED   Potassium 3.4 (L) 3.5 - 5.1 mmol/L   Chloride 108 98 - 111 mmol/L   CO2 26 22 - 32 mmol/L   Glucose, Bld 136 (H) 70 - 99 mg/dL    Comment: Glucose reference range applies only to samples taken after fasting for at least 8 hours.   BUN 10 6 - 20 mg/dL   Creatinine, Ser 3.23 (L) 0.61 - 1.24 mg/dL   Calcium 9.0 8.9 - 55.7 mg/dL    GFR, Estimated >32 >20 mL/min    Comment: (NOTE) Calculated using the CKD-EPI Creatinine Equation (2021)    Anion gap 9 5 - 15    Comment: Performed at [facility], [address] [place], [place] [address]  CBC     Status: Abnormal   Collection Time: 04/13/21  4:09 AM  Result Value Ref Range   WBC 8.2 4.0 - 10.5 K/uL   RBC 2.27 (L) 4.22 - 5.81 MIL/uL   Hemoglobin 8.4 (L) 13.0 - 17.0 g/dL   HCT 06.2 (L) 37.6 - 28.3 %   MCV 105.7 (H) 80.0 - 100.0 fL   MCH 37.0 (H) 26.0 - 34.0 pg   MCHC 35.0 30.0 - 36.0 g/dL   RDW 15.1 76.1 - 60.7 %   Platelets 383 150 - 400 K/uL   nRBC 0.0 0.0 - 0.2 %    Comment: Performed at [facility], [address] [place], [place] [address]  Magnesium     Status: Abnormal   Collection Time: 04/13/21  4:09 AM  Result Value Ref Range   Magnesium 1.6 (L) 1.7 - 2.4 mg/dL    Comment: Performed at [facility], [address] [place], [place] [address]  Phosphorus     Status: Abnormal   Collection Time: 04/13/21  4:09 AM  Result Value Ref Range   Phosphorus 5.2 (H) 2.5 - 4.6 mg/dL    Comment: Performed at [facility], [address], [place], [place] [address]  Basic metabolic panel     Status: Abnormal   Collection Time: 04/14/21  3:58 AM  Result Value Ref Range   Sodium 136 135 - 145 mmol/L    Comment: DELTA CHECK NOTED   Potassium 3.8 3.5 - 5.1 mmol/L   Chloride 106 98 - 111 mmol/L   CO2 22 22 - 32 mmol/L   Glucose, Bld 92 70 - 99 mg/dL    Comment: Glucose reference range applies only to samples taken after fasting for at least 8 hours.   BUN 9 6 - 20 mg/dL   Creatinine, Ser 0.35 0.61 - 1.24 mg/dL   Calcium 8.5 (L) 8.9 - 10.3 mg/dL   GFR, Estimated >00 >93 mL/min    Comment: (NOTE) Calculated using the CKD-EPI Creatinine Equation (2021)    Anion gap 8 5 - 15    Comment: Performed at [facility], [address], [place], [place] [address]  CBC      Status: Abnormal   Collection Time: 04/14/21  3:58 AM  Result Value Ref Range   WBC 10.7 (H) 4.0 - 10.5 K/uL   RBC 2.41 (L) 4.22 - 5.81 MIL/uL  Hemoglobin 9.1 (L) 13.0 - 17.0 g/dL   HCT 48.5 (L) 46.2 - 70.3 %   MCV 107.9 (H) 80.0 - 100.0 fL   MCH 37.8 (H) 26.0 - 34.0 pg   MCHC 35.0 30.0 - 36.0 g/dL   RDW 50.0 93.8 - 18.2 %   Platelets 377 150 - 400 K/uL   nRBC 0.0 0.0 - 0.2 %    Comment: Performed at [facility], [address] [place], [place] [address]  Magnesium     Status: None   Collection Time: 04/14/21  3:58 AM  Result Value Ref Range   Magnesium 1.7 1.7 - 2.4 mg/dL    Comment: Performed at [facility], [address], [place], [place] [address]    Current Facility-Administered Medications  Medication Dose Route Frequency Provider Last Rate Last Admin   acetaminophen (TYLENOL) tablet 650 mg  650 mg Oral Q6H PRN Bobette Mo, MD   650 mg at 04/11/21 1410   Or   acetaminophen (TYLENOL) suppository 650 mg  650 mg Rectal Q6H PRN Bobette Mo, MD       docusate sodium (COLACE) capsule 100 mg  100 mg Oral BID Pokhrel, Laxman, MD   100 mg at 04/12/21 2218   folic acid (FOLVITE) tablet 1 mg  1 mg Oral Daily Bobette Mo, MD   1 mg at 04/13/21 1035   gabapentin (NEURONTIN) capsule 100 mg  100 mg Oral TID Pokhrel, Laxman, MD   100 mg at 04/13/21 2130   ibuprofen (ADVIL) tablet 600 mg  600 mg Oral Q6H PRN Pokhrel, Laxman, MD   600 mg at 04/12/21 9381   LORazepam (ATIVAN) injection 0-4 mg  0-4 mg Intravenous Q8H Bobette Mo, MD   1 mg at 04/12/21 2214   magnesium oxide (MAG-OX) tablet 400 mg  400 mg Oral BID Pokhrel, Laxman, MD   400 mg at 04/13/21 2130   multivitamin with minerals tablet 1 tablet  1 tablet Oral Daily Bobette Mo, MD   1 tablet at 04/13/21 1035   OLANZapine (ZYPREXA) tablet 5 mg  5 mg Oral QHS Cinderella, Margaret A   5 mg at 04/13/21 2130   ondansetron (ZOFRAN) tablet 4 mg  4 mg Oral Q6H PRN  Bobette Mo, MD       Or   ondansetron [facility]) injection 4 mg  4 mg Intravenous Q6H PRN Bobette Mo, MD       pantoprazole (PROTONIX) EC tablet 40 mg  40 mg Oral Daily Pokhrel, Laxman, MD   40 mg at 04/13/21 1035   polyethylene glycol (MIRALAX / GLYCOLAX) packet 17 g  17 g Oral Daily Pokhrel, Laxman, MD       potassium chloride SA (KLOR-CON) CR tablet 40 mEq  40 mEq Oral BID Pokhrel, Laxman, MD   40 mEq at 04/13/21 2131   thiamine (B-1) 200 mg in sodium chloride 0.9 % 50 mL IVPB  200 mg Intravenous TID Cinderella, Margaret A   Stopped at 04/14/21 0175   Followed by   Melene Muller ON 04/17/2021] thiamine tablet 100 mg  100 mg Oral Daily Cinderella, Margaret A        Musculoskeletal: Strength & Muscle Tone: within normal limits Gait & Station: normal Patient leans: N/A            Psychiatric Specialty Exam:  Presentation  General Appearance:  No data recorded Eye Contact: No data recorded Speech: No data recorded Speech Volume: No data  recorded Handedness: No data recorded  Mood and Affect  Mood: No data recorded Affect: No data recorded  Thought Process  Thought Processes: No data recorded Descriptions of Associations:No data recorded Orientation:No data recorded Thought Content:No data recorded History of Schizophrenia/Schizoaffective disorder:No data recorded Duration of Psychotic Symptoms:No data recorded Hallucinations:No data recorded Ideas of Reference:No data recorded Suicidal Thoughts:No data recorded Homicidal Thoughts:No data recorded  Sensorium  Memory: No data recorded Judgment: No data recorded Insight: No data recorded  Executive Functions  Concentration: No data recorded Attention Span: No data recorded Recall: No data recorded Fund of Knowledge: No data recorded Language: No data recorded  Psychomotor Activity  Psychomotor Activity: No data recorded  Assets  Assets: No data recorded  Sleep  Sleep: No  data recorded  Physical Exam: Physical Exam Vitals and nursing note reviewed.  Skin:    General: Skin is dry.  Neurological:     Mental Status: He is alert.  Psychiatric:        Attention and Perception: Attention normal.        Mood and Affect: Mood normal.        Speech: Speech normal.        Behavior: Behavior normal.        Thought Content: Thought content normal. Thought content is not paranoid or delusional. Thought content does not include suicidal ideation.        Cognition and Memory: Cognition normal.        Judgment: Judgment normal.   Review of Systems  HENT: Negative.    Cardiovascular: Negative.   Gastrointestinal: Negative.   Psychiatric/Behavioral:  Positive for substance abuse. Negative for depression and suicidal ideas. The patient is not nervous/anxious.   All other systems reviewed and are negative. Blood pressure 105/68, pulse (!) 104, temperature 98.4 F (36.9 C), temperature source Oral, resp. rate 16, height 6\' 4"  (1.93 m), weight 68 kg, SpO2 100 %. Body mass index is 18.26 kg/m.  Treatment Plan Summary: Geffrey Michaelsen 55 year old African-American male.  Psychiatry consult was requested due to paranoia ideations, alcohol abuse and decreased appetite.  Patient's overall mood appears to be improving.  He continues to deny suicidal or homicidal ideations.  Denies auditory or visual hallucinations.  Denied any paranoid ideations.  Declined resources for substance abuse.  He provided verbal authorization to follow-up with his sister 58 concerning outpatient resources. No answer at the phone number listed.  Patient to continue medications as indicated.  Patient to be cleared from psychiatric services at this time.    Disposition: No evidence of imminent risk to self or others at present.   Patient does not meet criteria for psychiatric inpatient admission. Supportive therapy provided about ongoing stressors. Refer to IOP. Discussed crisis plan, support  from social network, calling 911, coming to the Emergency Department, and calling Suicide Hotline.  Toni Amend, NP 04/14/2021 10:37 AM

## 2021-04-14 NOTE — Progress Notes (Signed)
PROGRESS NOTE  Mark [name]:[id number] DOB: 1965/10/15 DOA: 04/10/2021 PCP: [facility]   LOS: 3 days   Brief narrative: Mark [name] is a 55 y.o. male with with past medical history of ADHD, alcohol abuse presented to hospital brought in to the hospital after his sister and mother went to his residence to do a welfare check.  Sister stated that patient was very paranoid recently and his house was in total disarray and was unable to take care of himself.  In the ED, his vital signs were stable but received 1 mg of Ativan IV fluids.  His initial labs showed normal white count phosphorus was at 1.3 and magnesium 1.6.  Potassium was low at 2.8.  Patient was then admitted to the hospital for further evaluation and treatment.  Assessment/Plan:  Principal Problem:   Alcohol withdrawal (HCC) Active Problems:   Hypomagnesemia   Hypokalemia   Hypophosphatemia   Hyperglycemia   ETOH abuse   Alcohol-induced psychosis (HCC)  Alcohol withdrawal Patient with history of alcohol abuse in the past. Heavy drinker for 25 years.  Continue folic acid, multivitamin, thiamine.  On CIWA protocol.  TOC has been consulted for assistance on discharge.  Multiple acute stress/paranoia.  significant Financial stress, family stress.  Psychiatry on board and patient has been initiated on olanzapine.  Psychiatry following.  Poor oral intake, significant weight loss  Due to ETOH abuse, stressors.  Patient was encouraged on oral intake.  Prolonged QTC.  Improved after electrolyte replacement.   Hypomagnesemia improved after replacement.  Magnesium 1.7 today.   Hypokalemia Improved after replacement.  Potassium 3.8 today.     Hypophosphatemia Improved.       Hyperglycemia Likely reactive.  Hemoglobin A1c of 4.9.  Deconditioning, debility, difficulty ambulation.  Seen by physical therapy recommended supervision and 24-hour assistance/home health PT.  Patient will  need home health OT PT  RN and social work on discharge.  Peripheral neuropathy.  Back pain.  Continue low-dose gabapentin.   DVT prophylaxis: SCDs Start: 04/10/21 1304  Code Status: Full code  Family Communication:  None today.  I spoke with the patient's sister on the phone and updated her about the clinical condition of the patient on 04/13/2021.   Status is: Inpatient  The patient is inpatient because: IV treatments appropriate due to intensity of illness or inability to take PO and Inpatient level of care appropriate due to severity of illness, high risk of alcohol withdrawal symptoms,   Dispo: The patient is from: Home              Anticipated d/c is to: Home likely by tomorrow              Patient currently is not medically stable to d/c.   Difficult to place patient No  Consultants: Psychiatry.  Procedures: none  Anti-infectives:  None  Anti-infectives (From admission, onward)    None      Subjective: Today, patient was seen and examined at bedside. Patient denies pain, no nausea vomiting or diarrhea.  No hallucinations, delusions.  Objective: Vitals:   04/13/21 2023 04/14/21 0535  BP: 112/80 105/68  Pulse: (!) 106 (!) 104  Resp: 18 16  Temp: 98.5 F (36.9 C) 98.4 F (36.9 C)  SpO2: 100% 100%    Intake/Output Summary (Last 24 hours) at 04/14/2021 0846 Last data filed at 04/14/2021 0500 Gross per 24 hour  Intake 997.92 ml  Output 1850 ml  Net -852.08 ml    Ceasar Mons  Weights   04/10/21 1015  Weight: 68 kg   Body mass index is 18.26 kg/m.   Physical Exam:  General: Thinly built, not in obvious distress HENT:   No scleral pallor or icterus noted. Oral mucosa is moist.  Chest:  Clear breath sounds.  Diminished breath sounds bilaterally. No crackles or wheezes.  CVS: S1 &S2 heard. No murmur.  Regular rate and rhythm. Abdomen: Soft, nontender, nondistended.  Bowel sounds are heard.   Extremities: No cyanosis, clubbing or edema.  Peripheral pulses are palpable. Psych:  Alert, awake and oriented to place.,  Poor insight.  Flat affect. CNS:  No cranial nerve deficits.  Moving all extremities Skin: Warm and dry.  No rashes noted.  Data Review: I have personally reviewed the following laboratory data and studies,  CBC: Recent Labs  Lab 04/10/21 1045 04/11/21 0347 04/12/21 0406 04/13/21 0409 04/14/21 0358  WBC 6.7 6.7 7.9 8.2 10.7*  NEUTROABS 3.7  --   --   --   --   HGB 12.4* 9.5* 8.9* 8.4* 9.1*  HCT 33.3* 26.3* 25.2* 24.0* 26.0*  MCV 101.5* 103.5* 106.3* 105.7* 107.9*  PLT 233 224 327 383 377    Basic Metabolic Panel: Recent Labs  Lab 04/10/21 1045 04/11/21 0347 04/12/21 0406 04/13/21 0409 04/14/21 0358  NA 134* 132* 135 143 136  K 2.8* 3.4* 4.0 3.4* 3.8  CL 92* 102 105 108 106  CO2 29 26 25 26 22   GLUCOSE 164* 138* 106* 136* 92  BUN 15 10 7 10 9   CREATININE 0.71 0.51* 0.49* 0.54* 0.74  CALCIUM 9.8 8.7* 8.7* 9.0 8.5*  MG 1.6*  --  1.9 1.6* 1.7  PHOS 1.3*  --  1.6* 5.2*  --     Liver Function Tests: Recent Labs  Lab 04/10/21 1045 04/11/21 0347 04/12/21 0406  AST 110* 70* 51*  ALT 36 30 27  ALKPHOS 154* 114 100  BILITOT 1.7* 1.0 0.6  PROT 7.3 5.6* 5.4*  ALBUMIN 3.7 3.1* 2.9*    Recent Labs  Lab 04/10/21 1045  LIPASE 35    No results for input(s): AMMONIA in the last 168 hours. Cardiac Enzymes: No results for input(s): CKTOTAL, CKMB, CKMBINDEX, TROPONINI in the last 168 hours. BNP (last 3 results) No results for input(s): BNP in the last 8760 hours.  ProBNP (last 3 results) No results for input(s): PROBNP in the last 8760 hours.  CBG: No results for input(s): GLUCAP in the last 168 hours. No results found for this or any previous visit (from the past 240 hour(s)).   Studies: No results found.   06/12/21, MD  [facility] 04/14/2021  If 7PM-7AM, please contact night-coverage

## 2021-04-15 LAB — CBC
HCT: 25.1 % — ABNORMAL LOW (ref 39.0–52.0)
Hemoglobin: 8.8 g/dL — ABNORMAL LOW (ref 13.0–17.0)
MCH: 37.8 pg — ABNORMAL HIGH (ref 26.0–34.0)
MCHC: 35.1 g/dL (ref 30.0–36.0)
MCV: 107.7 fL — ABNORMAL HIGH (ref 80.0–100.0)
Platelets: 427 10*3/uL — ABNORMAL HIGH (ref 150–400)
RBC: 2.33 MIL/uL — ABNORMAL LOW (ref 4.22–5.81)
RDW: 13.2 % (ref 11.5–15.5)
WBC: 13.3 10*3/uL — ABNORMAL HIGH (ref 4.0–10.5)
nRBC: 0 % (ref 0.0–0.2)

## 2021-04-15 LAB — MAGNESIUM: Magnesium: 1.6 mg/dL — ABNORMAL LOW (ref 1.7–2.4)

## 2021-04-15 LAB — BASIC METABOLIC PANEL
Anion gap: 6 (ref 5–15)
BUN: 11 mg/dL (ref 6–20)
CO2: 25 mmol/L (ref 22–32)
Calcium: 9.4 mg/dL (ref 8.9–10.3)
Chloride: 110 mmol/L (ref 98–111)
Creatinine, Ser: 0.7 mg/dL (ref 0.61–1.24)
GFR, Estimated: >60 mL/min (ref >60)
Glucose, Bld: 101 mg/dL — ABNORMAL HIGH (ref 70–99)
Potassium: 4.3 mmol/L (ref 3.5–5.1)
Sodium: 141 mmol/L (ref 135–145)

## 2021-04-15 MED ORDER — ADULT MULTIVITAMIN W/MINERALS CH: 1.0000 | ORAL_TABLET | Freq: Every day | ORAL | 0 refills | Status: AC

## 2021-04-15 MED ORDER — IBUPROFEN 600 MG PO TABS: 600.0000 mg | ORAL_TABLET | Freq: Four times a day (QID) | ORAL | 0 refills | Status: AC | PRN

## 2021-04-15 MED ORDER — PANTOPRAZOLE SODIUM 40 MG PO TBEC: 40.0000 mg | DELAYED_RELEASE_TABLET | Freq: Every day | ORAL | 0 refills | Status: AC

## 2021-04-15 MED ORDER — FOLIC ACID 1 MG PO TABS: 1.0000 mg | ORAL_TABLET | Freq: Every day | ORAL | 0 refills | Status: AC

## 2021-04-15 MED ORDER — OLANZAPINE 5 MG PO TABS: 5.0000 mg | ORAL_TABLET | Freq: Every day | ORAL | 2 refills | Status: AC

## 2021-04-15 MED ORDER — MAGNESIUM OXIDE -MG SUPPLEMENT 400 (240 MG) MG PO TABS: 400.0000 mg | ORAL_TABLET | Freq: Two times a day (BID) | ORAL | 0 refills | Status: AC

## 2021-04-15 MED ORDER — GABAPENTIN 100 MG PO CAPS: 100.0000 mg | ORAL_CAPSULE | Freq: Three times a day (TID) | ORAL | 0 refills | Status: AC

## 2021-04-15 MED ORDER — DOCUSATE SODIUM 100 MG PO CAPS: 100.0000 mg | ORAL_CAPSULE | Freq: Two times a day (BID) | ORAL | 0 refills | Status: AC

## 2021-04-15 MED ORDER — THIAMINE HCL 100 MG PO TABS: 100.0000 mg | ORAL_TABLET | Freq: Every day | ORAL | 0 refills | Status: AC

## 2021-04-15 NOTE — Progress Notes (Signed)
Dan Humphreys will be delivered to pt in 2-3 days from [facility] supplier. Pt will pick up walker from Medical supply store until walker arrives.SRP, RN

## 2021-04-15 NOTE — Progress Notes (Signed)
AVS reviewed with pt and pt family. All questions answered. IV taken out and belongings returned. Paper of resources to pick up walker given. Scharlene Corn, RN

## 2021-04-15 NOTE — TOC Transition Note (Addendum)
Transition of Care [facility]) - CM/SW Discharge Note   Patient Details  Name: Mark [name] MRN: [id number] Date of Birth: Jan 14, 1966  Transition of Care [facility]) CM/SW Contact:  Darleene Cleaver, LCSW Phone Number: 04/15/2021, 3:23 PM   Clinical Narrative:     CSW contacted [place] [facility] to discuss if they are able to deliver a walker before patient leaves today.  Per [place], they only do oxygen for [place].  CSW was informed that patient's DME orders need to be faxed to [place] in [place] and it will take 2-3 days to receive equipment.  CSW explained to patient and his sister, they are going to try to get a walker from a local supplier as a rental until [place] delivers his walker.  CSW also was asked to call sister to discuss in home care providers for patient, CSW informed her that he would have to pay privately, CSW provided a list of Private duty care agencies to patient so they can call around for pricing.  CSW informed patient's sister that depending on [place], patient may qualify for in home care providers, and she would have to speak to the social worker at the [place] for the patient.  CSW provided the name of the social worker to patient's sister last week.  VA social worker is [employer].  CSW contacted [facility] and confirmed they have received HH orders, per [facility], [facility] is still [facility], but will notify home health agency as soon as orders are received.  CSW notified Elnita Maxwell that patient will be discharging today.  CSW included substance abuse resources on patient's AVS.  CSW also provided hard copy of resources to patient's sister.   Final next level of care: Home w Home Health Services Barriers to Discharge: Barriers Resolved   Patient Goals and CMS Choice Patient states their goals for this hospitalization and ongoing recovery are:: To return back home with home health services. CMS Medicare.gov Compare Post Acute Care list provided to:: Patient Represenative  (must comment) Choice offered to / list presented to : Patient, Sibling, Parent  Discharge Placement                       Discharge Plan and Services                DME Arranged: Dan Humphreys DME Agency: [facility] [place] Date DME Agency Contacted: 04/15/21 Time DME Agency Contacted: 1200   HH Arranged: PT, OT, RN, Social Work, Nurse's Aide HH Agency: [facility] Date HH Agency Contacted: 04/15/21 Time HH Agency Contacted: 1523 Representative spoke with at [facility] Agency: Elnita Maxwell  Social Determinants of Health (SDOH) Interventions     Readmission Risk Interventions No flowsheet data found.

## 2021-04-15 NOTE — Discharge Summary (Signed)
Physician Discharge Summary  Mark [name]:[id number] DOB: 02/04/1966 DOA: 04/10/2021  PCP: [facility]  Admit date: 04/10/2021 Discharge date: 04/15/2021  Admitted From: Home  Discharge disposition:  Home  Recommendations for Outpatient Follow-Up:   Follow up with your primary care provider in one week.  Check CBC, BMP, magnesium in the next visit  Discharge Diagnosis:   Principal Problem:   Alcohol withdrawal (HCC) Active Problems:   Hypomagnesemia   Hypokalemia   Hypophosphatemia   Hyperglycemia   ETOH abuse   Alcohol-induced psychosis (HCC)   Discharge Condition: Improved.  Diet recommendation: Low sodium, heart healthy.    Wound care: None.  Code status: Full.   History of Present Illness:   Mark [name] is a 55 y.o. male with with past medical history of ADHD, alcohol abuse presented to hospital brought in to the hospital after his sister and mother went to his residence to do a welfare check.  Sister stated that patient was very paranoid recently and his house was in total disarray and was unable to take care of himself.  In the ED, his vital signs were stable but received 1 mg of Ativan IV fluids.  His initial labs showed normal white count phosphorus was at 1.3 and magnesium 1.6.  Potassium was low at 2.8.  Patient was then admitted to the hospital for further evaluation and treatment.  Hospital Course:   Following conditions were addressed during hospitalization as listed below,  Alcohol withdrawal Patient with history of alcohol abuse in the past. Heavy drinker for 25 years.  Patient received folic acid, multivitamin, thiamine to be continued on discharge.  Olegario Messier has seen the patient as well.  Patient will follow-up with outpatient resources after discharge and at the [facility] facility.  Patient did not have any withdrawal symptoms prior to discharge.    Multiple acute stress/paranoia.  significant Financial stress, family stress.   Psychiatry on board and patient has been initiated on olanzapine.  Psychiatry followed the patient during hospitalization.  Patient will be continued on olanzapine until seen by his psychiatrist at the [facility] facility.   Poor oral intake, significant weight loss  Due to ETOH abuse, stressors.  Patient was encouraged on oral intake on discharge..   Prolonged QTC.  Improved after electrolyte replacement.  Continue magnesium supplement on discharge.    Hypomagnesemia improved after replacement.  Magnesium of 1.6.    Hypokalemia Improved after replacement.  Potassium of 4.3.     Hypophosphatemia Improved.       Hyperglycemia Likely reactive.  Hemoglobin A1c of 4.9.   Deconditioning, debility, difficulty ambulation.  Seen by physical therapy who recommended supervision and 24-hour assistance/home health PT.  Patient will  be arranged on home health OT PT RN and social work on discharge.   Peripheral neuropathy/Back pain.  Continue low-dose gabapentin on discharge..  Disposition.  At this time, patient is stable for disposition home with outpatient PCP follow-up.  Medical Consultants:   Psychiatry  Procedures:    None Subjective:   Today, patient was seen and examined at bedside.  Denies any pain, nausea, vomiting, fever, hallucinations, delusions or tremors  Discharge Exam:   Vitals:   04/14/21 2117 04/15/21 0532  BP: 124/85 124/80  Pulse: 97 98  Resp: 16 16  Temp: 98.5 F (36.9 C) 98.5 F (36.9 C)  SpO2: 100% 100%   Vitals:   04/14/21 0535 04/14/21 1119 04/14/21 2117 04/15/21 0532  BP: 105/68 122/87 124/85 124/80  Pulse: (!) 104 (!)  108 97 98  Resp: 16 16 16 16   Temp: 98.4 F (36.9 C) 98.4 F (36.9 C) 98.5 F (36.9 C) 98.5 F (36.9 C)  TempSrc: Oral Oral Oral Oral  SpO2: 100% 100% 100% 100%  Weight:      Height:        General: Alert awake, not in obvious distress, thinly built HENT: pupils equally reacting to light,  No scleral pallor or icterus noted. Oral  mucosa is moist.  Chest:  Clear breath sounds.  Diminished breath sounds bilaterally. No crackles or wheezes.  CVS: S1 &S2 heard. No murmur.  Regular rate and rhythm. Abdomen: Soft, nontender, nondistended.  Bowel sounds are heard.   Extremities: No cyanosis, clubbing or edema.  Peripheral pulses are palpable. Psych: Alert, awake and oriented, normal mood, flat affect. CNS:  No cranial nerve deficits.  Power equal in all extremities.   Skin: Warm and dry.  No rashes noted.  The results of significant diagnostics from this hospitalization (including imaging, microbiology, ancillary and laboratory) are listed below for reference.     Diagnostic Studies:   No results found.   Labs:   Basic Metabolic Panel: Recent Labs  Lab 04/10/21 1045 04/11/21 0347 04/12/21 0406 04/13/21 0409 04/14/21 0358 04/15/21 0411  NA 134* 132* 135 143 136 141  K 2.8* 3.4* 4.0 3.4* 3.8 4.3  CL 92* 102 105 108 106 110  CO2 29 26 25 26 22 25   GLUCOSE 164* 138* 106* 136* 92 101*  BUN 15 10 7 10 9 11   CREATININE 0.71 0.51* 0.49* 0.54* 0.74 0.70  CALCIUM 9.8 8.7* 8.7* 9.0 8.5* 9.4  MG 1.6*  --  1.9 1.6* 1.7 1.6*  PHOS 1.3*  --  1.6* 5.2*  --   --    GFR Estimated Creatinine Clearance: 99.2 mL/min (by C-G formula based on SCr of 0.7 mg/dL). Liver Function Tests: Recent Labs  Lab 04/10/21 1045 04/11/21 0347 04/12/21 0406  AST 110* 70* 51*  ALT 36 30 27  ALKPHOS 154* 114 100  BILITOT 1.7* 1.0 0.6  PROT 7.3 5.6* 5.4*  ALBUMIN 3.7 3.1* 2.9*   Recent Labs  Lab 04/10/21 1045  LIPASE 35   No results for input(s): AMMONIA in the last 168 hours. Coagulation profile No results for input(s): INR, PROTIME in the last 168 hours.  CBC: Recent Labs  Lab 04/10/21 1045 04/11/21 0347 04/12/21 0406 04/13/21 0409 04/14/21 0358 04/15/21 0411  WBC 6.7 6.7 7.9 8.2 10.7* 13.3*  NEUTROABS 3.7  --   --   --   --   --   HGB 12.4* 9.5* 8.9* 8.4* 9.1* 8.8*  HCT 33.3* 26.3* 25.2* 24.0* 26.0* 25.1*  MCV  101.5* 103.5* 106.3* 105.7* 107.9* 107.7*  PLT 233 224 327 383 377 427*   Cardiac Enzymes: No results for input(s): CKTOTAL, CKMB, CKMBINDEX, TROPONINI in the last 168 hours. BNP: Invalid input(s): POCBNP CBG: No results for input(s): GLUCAP in the last 168 hours. D-Dimer No results for input(s): DDIMER in the last 72 hours. Hgb A1c No results for input(s): HGBA1C in the last 72 hours. Lipid Profile No results for input(s): CHOL, HDL, LDLCALC, TRIG, CHOLHDL, LDLDIRECT in the last 72 hours. Thyroid function studies No results for input(s): TSH, T4TOTAL, T3FREE, THYROIDAB in the last 72 hours.  Invalid input(s): FREET3 Anemia work up No results for input(s): VITAMINB12, FOLATE, FERRITIN, TIBC, IRON, RETICCTPCT in the last 72 hours. Microbiology No results found for this or any previous visit (from the past  240 hour(s)).   Discharge Instructions:   Discharge Instructions     Diet - low sodium heart healthy   Complete by: As directed    Discharge instructions   Complete by: As directed    Follow-up with your primary care physician in 1 week.  Follow-up at the [facility] for ongoing services.  Continue physical therapy at home.  Take medications as prescribed.   Increase activity slowly   Complete by: As directed       Allergies as of 04/15/2021   No Known Allergies      Medication List     STOP taking these medications    hydrocortisone cream 1 %       TAKE these medications    docusate sodium 100 MG capsule Commonly known as: COLACE Take 1 capsule (100 mg total) by mouth 2 (two) times daily.   folic acid 1 MG tablet Commonly known as: FOLVITE Take 1 tablet (1 mg total) by mouth daily.   gabapentin 100 MG capsule Commonly known as: NEURONTIN Take 1 capsule (100 mg total) by mouth 3 (three) times daily.   ibuprofen 600 MG tablet Commonly known as: ADVIL Take 1 tablet (600 mg total) by mouth every 6 (six) hours as needed for moderate pain, mild pain or  headache.   magnesium oxide 400 (240 Mg) MG tablet Commonly known as: MAG-OX Take 1 tablet (400 mg total) by mouth 2 (two) times daily for 15 days.   multivitamin with minerals Tabs tablet Take 1 tablet by mouth daily.   OLANZapine 5 MG tablet Commonly known as: ZYPREXA Take 1 tablet (5 mg total) by mouth at bedtime.   pantoprazole 40 MG tablet Commonly known as: PROTONIX Take 1 tablet (40 mg total) by mouth daily for 15 days.   thiamine 100 MG tablet Take 1 tablet (100 mg total) by mouth daily. Start taking on: April 17, 2021        Follow-up Information     [facility] home health Follow up.   Why: Home health agency will call you to set up first visit. Contact information: Elnita Maxwell [phone]        [facility] Schedule an appointment as soon as possible for a visit.   Why: Call [phone] within 72 hours of discharge from hospital to make an appointment for a hospital follow up. Contact information: [address] [facility] [place] [address] [phone]                  Time coordinating discharge: 39 minutes  Signed:  Melisse Caetano  [facility] 04/15/2021, 8:17 AM
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
